# Patient Record
Sex: Female | Born: 1965 | Race: Black or African American | Hispanic: No | Smoking: Never smoker
Health system: Southern US, Community
[De-identification: ages and names within clinical notes are randomized; demographics above are authoritative.]

## PROBLEM LIST (undated history)

## (undated) DIAGNOSIS — Z825 Family history of asthma and other chronic lower respiratory diseases: Secondary | ICD-10-CM

## (undated) DIAGNOSIS — Z8679 Personal history of other diseases of the circulatory system: Secondary | ICD-10-CM

## (undated) DIAGNOSIS — Z8249 Family history of ischemic heart disease and other diseases of the circulatory system: Secondary | ICD-10-CM

## (undated) DIAGNOSIS — N915 Oligomenorrhea, unspecified: Secondary | ICD-10-CM

## (undated) DIAGNOSIS — K219 Gastro-esophageal reflux disease without esophagitis: Secondary | ICD-10-CM

## (undated) DIAGNOSIS — Z8669 Personal history of other diseases of the nervous system and sense organs: Secondary | ICD-10-CM

## (undated) DIAGNOSIS — R638 Other symptoms and signs concerning food and fluid intake: Secondary | ICD-10-CM

## (undated) DIAGNOSIS — B977 Papillomavirus as the cause of diseases classified elsewhere: Secondary | ICD-10-CM

## (undated) DIAGNOSIS — IMO0002 Reserved for concepts with insufficient information to code with codable children: Secondary | ICD-10-CM

## (undated) DIAGNOSIS — J40 Bronchitis, not specified as acute or chronic: Secondary | ICD-10-CM

## (undated) DIAGNOSIS — Z8742 Personal history of other diseases of the female genital tract: Secondary | ICD-10-CM

## (undated) DIAGNOSIS — Z87442 Personal history of urinary calculi: Secondary | ICD-10-CM

## (undated) DIAGNOSIS — N870 Mild cervical dysplasia: Secondary | ICD-10-CM

## (undated) DIAGNOSIS — B009 Herpesviral infection, unspecified: Secondary | ICD-10-CM

## (undated) DIAGNOSIS — R896 Abnormal cytological findings in specimens from other organs, systems and tissues: Secondary | ICD-10-CM

## (undated) DIAGNOSIS — Z8619 Personal history of other infectious and parasitic diseases: Secondary | ICD-10-CM

## (undated) DIAGNOSIS — D649 Anemia, unspecified: Secondary | ICD-10-CM

## (undated) DIAGNOSIS — Z8349 Family history of other endocrine, nutritional and metabolic diseases: Secondary | ICD-10-CM

## (undated) DIAGNOSIS — D869 Sarcoidosis, unspecified: Secondary | ICD-10-CM

## (undated) DIAGNOSIS — R51 Headache: Secondary | ICD-10-CM

## (undated) DIAGNOSIS — R011 Cardiac murmur, unspecified: Secondary | ICD-10-CM

## (undated) HISTORY — DX: Herpesviral infection, unspecified: B00.9

## (undated) HISTORY — DX: Sarcoidosis, unspecified: D86.9

## (undated) HISTORY — DX: Family history of asthma and other chronic lower respiratory diseases: Z82.5

## (undated) HISTORY — DX: Anemia, unspecified: D64.9

## (undated) HISTORY — DX: Papillomavirus as the cause of diseases classified elsewhere: B97.7

## (undated) HISTORY — PX: TUBAL LIGATION: SHX77

## (undated) HISTORY — PX: DILATION AND CURETTAGE OF UTERUS: SHX78

## (undated) HISTORY — DX: Personal history of other infectious and parasitic diseases: Z86.19

## (undated) HISTORY — DX: Mild cervical dysplasia: N87.0

## (undated) HISTORY — DX: Family history of ischemic heart disease and other diseases of the circulatory system: Z82.49

## (undated) HISTORY — DX: Oligomenorrhea, unspecified: N91.5

## (undated) HISTORY — DX: Other symptoms and signs concerning food and fluid intake: R63.8

## (undated) HISTORY — DX: Personal history of other diseases of the nervous system and sense organs: Z86.69

## (undated) HISTORY — DX: Reserved for concepts with insufficient information to code with codable children: IMO0002

## (undated) HISTORY — PX: WISDOM TOOTH EXTRACTION: SHX21

## (undated) HISTORY — DX: Abnormal cytological findings in specimens from other organs, systems and tissues: R89.6

## (undated) HISTORY — PX: EYE SURGERY: SHX253

## (undated) HISTORY — DX: Family history of other endocrine, nutritional and metabolic diseases: Z83.49

## (undated) HISTORY — DX: Personal history of other diseases of the circulatory system: Z86.79

## (undated) HISTORY — DX: Personal history of other diseases of the female genital tract: Z87.42

---

## 2006-06-21 DIAGNOSIS — IMO0002 Reserved for concepts with insufficient information to code with codable children: Secondary | ICD-10-CM

## 2006-06-21 DIAGNOSIS — R87619 Unspecified abnormal cytological findings in specimens from cervix uteri: Secondary | ICD-10-CM

## 2006-06-21 HISTORY — DX: Unspecified abnormal cytological findings in specimens from cervix uteri: R87.619

## 2006-06-21 HISTORY — DX: Reserved for concepts with insufficient information to code with codable children: IMO0002

## 2011-01-20 DIAGNOSIS — IMO0001 Reserved for inherently not codable concepts without codable children: Secondary | ICD-10-CM

## 2011-01-20 DIAGNOSIS — B977 Papillomavirus as the cause of diseases classified elsewhere: Secondary | ICD-10-CM

## 2011-01-20 DIAGNOSIS — R638 Other symptoms and signs concerning food and fluid intake: Secondary | ICD-10-CM

## 2011-01-20 HISTORY — DX: Papillomavirus as the cause of diseases classified elsewhere: B97.7

## 2011-01-20 HISTORY — DX: Reserved for inherently not codable concepts without codable children: IMO0001

## 2011-01-20 HISTORY — DX: Other symptoms and signs concerning food and fluid intake: R63.8

## 2011-02-15 ENCOUNTER — Other Ambulatory Visit: Payer: Self-pay | Admitting: Obstetrics and Gynecology

## 2011-02-15 DIAGNOSIS — Z1231 Encounter for screening mammogram for malignant neoplasm of breast: Secondary | ICD-10-CM

## 2011-02-26 ENCOUNTER — Other Ambulatory Visit: Payer: Self-pay | Admitting: Obstetrics and Gynecology

## 2011-03-12 ENCOUNTER — Ambulatory Visit
Admission: RE | Admit: 2011-03-12 | Discharge: 2011-03-12 | Disposition: A | Discharge status: Home / Self Care | Payer: Commercial Managed Care - PPO | Source: Ambulatory Visit | Attending: Obstetrics and Gynecology | Admitting: Obstetrics and Gynecology

## 2011-03-12 DIAGNOSIS — Z1231 Encounter for screening mammogram for malignant neoplasm of breast: Secondary | ICD-10-CM

## 2011-03-16 ENCOUNTER — Encounter (HOSPITAL_COMMUNITY): Payer: Self-pay | Admitting: *Deleted

## 2011-03-22 DIAGNOSIS — N915 Oligomenorrhea, unspecified: Secondary | ICD-10-CM

## 2011-03-22 DIAGNOSIS — N870 Mild cervical dysplasia: Secondary | ICD-10-CM

## 2011-03-22 DIAGNOSIS — Z8742 Personal history of other diseases of the female genital tract: Secondary | ICD-10-CM

## 2011-03-22 HISTORY — DX: Mild cervical dysplasia: N87.0

## 2011-03-22 HISTORY — DX: Oligomenorrhea, unspecified: N91.5

## 2011-03-22 HISTORY — DX: Personal history of other diseases of the female genital tract: Z87.42

## 2011-03-22 HISTORY — PX: NOVASURE ABLATION: SHX5394

## 2011-03-31 NOTE — H&P (Signed)
NAME:  Ruth Hale, Ruth Hale NO.:  [id number]  MEDICAL RECORD NO.:  [id number]  LOCATION:  [facility]                         FACILITY:  [facility]  PHYSICIAN:  Osborn Coho, M.D.   DATE OF BIRTH:  03/19/1966  DATE OF ADMISSION:  02/26/2011 DATE OF DISCHARGE:                             HISTORY & PHYSICAL   HISTORY OF PRESENT ILLNESS:  Ruth Hale is a 45 year old divorced black female, para 2-0-0-2 presenting for hysteroscopy, D and C, and endometrial ablation because of metrorrhagia.  The patient reports a 7- year history of irregular and heavy bleeding that over the past year has worsened.  The patient's bleeding is characterized by a 3-7 day flow during which time she may change a pad along with a tampon every 15-30 minutes.  Toward the end of her menstrual flow, it may taper to her only requiring a pad and tampon change hourly.  Additionally, the patient experiences cramping that she rates at a 10/10 on a 10 point pain scale.  She is able to find some relief with ibuprofen 800 mg decreasing her discomfort to 6/10 on a 10 point pain scale.  The patient further gives a history of missing periods. She has gone as long as 6 months without periods, though she experiences monthly molimina.  An endometrial biopsy done August 2012 did not show any atypia, hyperplasia, or malignancy.  A pelvic ultrasound at the same time showed a uterus measuring 6.81 x 4.52 x 3.27 cm, a normal left ovary measuring 3.80 x 3.64 x 2.78 cm containing a simple ovarian cyst measuring 4.47 x 2.66 x 3.92 cm, and a normal-appearing right ovary measuring 2.13 x 2.48 x 1.23 cm.  She denies any urinary tract symptoms, changes in her bowel movements, dyspareunia, vaginitis symptoms, nausea or vomiting. Review of both medical and surgical management options were given to the patient, however, she desires to proceed with management of her symptoms with hysteroscopy, D and C, and endometrial ablation.  PAST  MEDICAL HISTORY:  OB HISTORY:  Gravida 2, para 2-0-0-2.  The patient had a cesarean section in 1992 and spontaneous vaginal birth in 1997.  GYN HISTORY:  Menarche at 45 years old.  Last menstrual period January 11, 2011.  The patient uses bilateral tubal ligation as a method of contraception.  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  The patient underwent a LEEP procedure in 2008 for abnormal Pap smears.  Her recent Pap smear showed atypical squamous cells of undetermined significance, and a positive high risk HPV.  Subsequent colposcopy returned CIN I.  MEDICAL HISTORY:  Sarcoidosis, anemia, rheumatic fever, and vitamin D deficiency.  SURGICAL HISTORY:  In 2000 bilateral tubal ligation and D and C.  She denies any history of blood transfusions or problems with anesthesia.  FAMILY HISTORY:  Cardiovascular disease, asthma, thyroid disease, breast cancer (mother - menopausal), diabetes mellitus, migraines, and stroke.  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  HABITS:  She does not use tobacco or illicit drugs.  She rarely consumes alcohol.  CURRENT MEDICATIONS: 1. Multivitamin daily. 2. Vitamin D 50,000 units weekly.  ALLERGIES:  She has no known drug allergies.  Denies any sensitivities to latex, peanuts, shellfish, or soy.  REVIEW OF SYSTEMS:  The patient does wear glasses.  She occasionally has night sweats.  Denies any headache, vision changes, difficulty swallowing, chronic cough, nausea, vomiting diarrhea, chest pain, shortness of breath, dysuria, hematuria, urinary frequency, urgency, or incontinence, flank pain, myalgias, arthralgias, or swelling of joints. Except as is mentioned in history of present illness, the patient's review of systems is otherwise negative.  PHYSICAL EXAMINATION:  VITAL SIGNS:  Blood pressure 120/88, pulse is 70, respiration 14, temperature 99 degrees  Fahrenheit orally, weight 253 pounds, height 5 feet 6 inches tall, body mass index 40.8. NECK:  Supple without masses.  There is no thyromegaly or cervical adenopathy. HEART:  Regular rate and rhythm. LUNGS:  Clear. BACK:  No CVA tenderness. ABDOMEN:  No tenderness, masses, or organomegaly. EXTREMITIES:  No clubbing, cyanosis, or edema. PELVIC:  EGBUS is normal.  Vagina is normal.  Cervix is nontender without lesions and is anterior.  Uterus is normal size, shape, and consistency without tenderness.  Adnexa, no tenderness or masses.  IMPRESSION: 1. Metrorrhagia. 2. Oligomenorrhea. 3. Simple Ovarian Cyst  DISPOSITION:  A discussion was held with the patient regarding the indications for her procedures along with their risks which include, but are not limited to reaction to anesthesia, damage to adjacent organs, excessive bleeding, infection, and scarring of the endometrium.  The patient verbalized understanding of these risks and has consented to proceed with hysteroscopy, D and C followed by hydrothermal ablation at [facility] on April 02, 2011 at 9:30 a.m.  She was given a copy of the [employer] brochure on hysteroscopy and D and C.     Mary Secord J. Lowell Guitar, P.A.-C.   ______________________________ Osborn Coho, M.D.    EJP/MEDQ  D:  03/31/2011  T:  03/31/2011  Job:  [id number]  Agree with Above - Jannetta Quint

## 2011-04-02 ENCOUNTER — Encounter (HOSPITAL_COMMUNITY): Payer: Self-pay | Admitting: Anesthesiology

## 2011-04-02 ENCOUNTER — Other Ambulatory Visit: Payer: Self-pay | Admitting: Obstetrics and Gynecology

## 2011-04-02 ENCOUNTER — Ambulatory Visit (HOSPITAL_COMMUNITY): Payer: Commercial Managed Care - PPO | Admitting: Anesthesiology

## 2011-04-02 ENCOUNTER — Ambulatory Visit (HOSPITAL_COMMUNITY)
Admission: RE | Admit: 2011-04-02 | Discharge: 2011-04-02 | Disposition: A | Discharge status: Home / Self Care | Payer: Commercial Managed Care - PPO | Source: Ambulatory Visit | Attending: Obstetrics and Gynecology | Admitting: Obstetrics and Gynecology

## 2011-04-02 ENCOUNTER — Encounter (HOSPITAL_COMMUNITY)
Admission: RE | Disposition: A | Discharge status: Home / Self Care | Payer: Self-pay | Source: Ambulatory Visit | Attending: Obstetrics and Gynecology

## 2011-04-02 DIAGNOSIS — N915 Oligomenorrhea, unspecified: Secondary | ICD-10-CM | POA: Insufficient documentation

## 2011-04-02 DIAGNOSIS — N83209 Unspecified ovarian cyst, unspecified side: Secondary | ICD-10-CM | POA: Insufficient documentation

## 2011-04-02 DIAGNOSIS — N921 Excessive and frequent menstruation with irregular cycle: Secondary | ICD-10-CM | POA: Insufficient documentation

## 2011-04-02 LAB — CBC
Platelets: 237 10*3/uL (ref 150–400)
RBC: 4.61 MIL/uL (ref 3.87–5.11)
WBC: 3.3 10*3/uL — ABNORMAL LOW (ref 4.0–10.5)

## 2011-04-02 LAB — HCG, SERUM, QUALITATIVE: Preg, Serum: NEGATIVE

## 2011-04-02 SURGERY — DILATATION & CURETTAGE/HYSTEROSCOPY WITH HYDROTHERMAL ABLATION
Anesthesia: General | Site: Vagina | Wound class: Clean Contaminated

## 2011-04-02 MED ORDER — SODIUM CHLORIDE 0.9 % IR SOLN
Status: DC | PRN
  Administered 2011-04-02: 3000 mL

## 2011-04-02 MED ORDER — IBUPROFEN 600 MG PO TABS: 600.0000 mg | ORAL_TABLET | Freq: Four times a day (QID) | ORAL | Status: AC | PRN

## 2011-04-02 MED ORDER — FENTANYL CITRATE 0.05 MG/ML IJ SOLN
INTRAMUSCULAR | Status: AC
  Filled 2011-04-02: qty 5

## 2011-04-02 MED ORDER — BUPIVACAINE-EPINEPHRINE 0.25% -1:200000 IJ SOLN: INTRAMUSCULAR | Status: DC | PRN

## 2011-04-02 MED ORDER — ONDANSETRON HCL 4 MG/2ML IJ SOLN
INTRAMUSCULAR | Status: DC | PRN
  Administered 2011-04-02: 4 mg via INTRAVENOUS

## 2011-04-02 MED ORDER — DEXAMETHASONE SODIUM PHOSPHATE 4 MG/ML IJ SOLN
INTRAMUSCULAR | Status: DC | PRN
  Administered 2011-04-02: 10 mg via INTRAVENOUS

## 2011-04-02 MED ORDER — LIDOCAINE HCL (CARDIAC) 20 MG/ML IV SOLN
INTRAVENOUS | Status: AC
  Filled 2011-04-02: qty 5

## 2011-04-02 MED ORDER — LIDOCAINE HCL (CARDIAC) 20 MG/ML IV SOLN
INTRAVENOUS | Status: DC | PRN
  Administered 2011-04-02: 50 mg via INTRAVENOUS
  Administered 2011-04-02: 40 mg via INTRAVENOUS

## 2011-04-02 MED ORDER — FENTANYL CITRATE 0.05 MG/ML IJ SOLN
INTRAMUSCULAR | Status: DC | PRN
  Administered 2011-04-02 (×5): 50 ug via INTRAVENOUS

## 2011-04-02 MED ORDER — PROPOFOL 10 MG/ML IV EMUL
INTRAVENOUS | Status: AC
  Filled 2011-04-02: qty 20

## 2011-04-02 MED ORDER — ONDANSETRON HCL 4 MG/2ML IJ SOLN
INTRAMUSCULAR | Status: AC
  Filled 2011-04-02: qty 2

## 2011-04-02 MED ORDER — KETOROLAC TROMETHAMINE 30 MG/ML IJ SOLN
INTRAMUSCULAR | Status: AC
  Filled 2011-04-02: qty 1

## 2011-04-02 MED ORDER — GLYCOPYRROLATE 0.2 MG/ML IJ SOLN
INTRAMUSCULAR | Status: DC | PRN
  Administered 2011-04-02: 0.1 mg via INTRAVENOUS

## 2011-04-02 MED ORDER — MIDAZOLAM HCL 5 MG/5ML IJ SOLN
INTRAMUSCULAR | Status: DC | PRN
  Administered 2011-04-02: 1 mg via INTRAVENOUS

## 2011-04-02 MED ORDER — DEXAMETHASONE SODIUM PHOSPHATE 10 MG/ML IJ SOLN
INTRAMUSCULAR | Status: AC
  Filled 2011-04-02: qty 1

## 2011-04-02 MED ORDER — HYDROCODONE-ACETAMINOPHEN 5-500 MG PO TABS: 1.0000 | ORAL_TABLET | Freq: Four times a day (QID) | ORAL | Status: AC | PRN

## 2011-04-02 MED ORDER — KETOROLAC TROMETHAMINE 30 MG/ML IJ SOLN
INTRAMUSCULAR | Status: DC | PRN
  Administered 2011-04-02: 30 mg via INTRAVENOUS

## 2011-04-02 MED ORDER — LACTATED RINGERS IV SOLN
INTRAVENOUS | Status: DC
  Administered 2011-04-02 (×2): via INTRAVENOUS
  Administered 2011-04-02: 1000 mL via INTRAVENOUS

## 2011-04-02 MED ORDER — MIDAZOLAM HCL 2 MG/2ML IJ SOLN
INTRAMUSCULAR | Status: AC
  Filled 2011-04-02: qty 2

## 2011-04-02 MED ORDER — LIDOCAINE HCL 1 % IJ SOLN
INTRAMUSCULAR | Status: DC | PRN
  Administered 2011-04-02: 10 mL

## 2011-04-02 MED ORDER — PROPOFOL 10 MG/ML IV EMUL
INTRAVENOUS | Status: DC | PRN
  Administered 2011-04-02: 250 mg via INTRAVENOUS

## 2011-04-02 SURGICAL SUPPLY — 16 items
CANISTER SUCTION 2500CC (MISCELLANEOUS) ×2 IMPLANT
CATH ROBINSON RED A/P 16FR (CATHETERS) ×2 IMPLANT
CLOTH BEACON ORANGE TIMEOUT ST (SAFETY) ×2 IMPLANT
CONTAINER PREFILL 10% NBF 60ML (FORM) ×4 IMPLANT
DRAPE CAMERA CLOSED 9X96 (DRAPES) ×2 IMPLANT
DRAPE HYSTEROSCOPY (DRAPE) ×2 IMPLANT
DRAPE UTILITY XL STRL (DRAPES) IMPLANT
GLOVE BIOGEL PI IND STRL 7.0 (GLOVE) ×1 IMPLANT
GLOVE BIOGEL PI INDICATOR 7.0 (GLOVE) ×1
GLOVE ECLIPSE 7.0 STRL STRAW (GLOVE) ×4 IMPLANT
GOWN PREVENTION PLUS LG XLONG (DISPOSABLE) ×2 IMPLANT
GOWN STRL REIN XL XLG (GOWN DISPOSABLE) ×4 IMPLANT
PACK VAGINAL MINOR WOMEN LF (CUSTOM PROCEDURE TRAY) ×2 IMPLANT
SET GENESYS HTA PROCERVA (MISCELLANEOUS) ×2 IMPLANT
TOWEL OR 17X24 6PK STRL BLUE (TOWEL DISPOSABLE) ×4 IMPLANT
WATER STERILE IRR 1000ML POUR (IV SOLUTION) ×2 IMPLANT

## 2011-04-02 NOTE — Transfer of Care (Signed)
Immediate Anesthesia Transfer of Care Note  Patient: Ruth Hale  Procedure(s) Performed:  DILATATION & CURETTAGE/HYSTEROSCOPY WITH HYDROTHERMAL ABLATION  Patient Location: PACU  Anesthesia Type: General  Level of Consciousness: awake, alert  and oriented  Airway & Oxygen Therapy: Patient Spontanous Breathing and Patient connected to nasal cannula oxygen  Post-op Assessment: Report given to PACU RN and Post -op Vital signs reviewed and stable  Post vital signs: Reviewed and stable  Complications: No apparent anesthesia complications

## 2011-04-02 NOTE — Op Note (Signed)
Preop Diagnosis: Metrorrhagia   Postop Diagnosis: Metrorrhagia   Procedure: DILATATION & CURETTAGE/HYSTEROSCOPY WITH HYDROTHERMAL ABLATION   Anesthesia: General   Anesthesiologist: Amy L. Rodman Pickle   Attending: Purcell Nails, MD   Assistant: n/a  Findings: No obvious intracavitary lesions.  Uterus sounded to 8cm.  Pathology: Endometrial Currettings  Fluids: 1700cc  UOP: QS via straight cath prior to procedure  EBL: minimal  Complications: none  Procedure: The patient was taken to the operating room after risks benefits and alternatives were discussed with patient, the patient verbalized understanding and consent signed and witnessed. The patient was placed under general anesthesia and prepped and draped in normal sterile fashion. A bivalve speculum was placed in the patient's vagina and the anterior lip of the cervix was grasped with a single-tooth tenaculum. The cervix was dilated for passage of the hysteroscope after paracervical block administered using 10cc of 1% lidocaine.  The uterus sounded to 8cm.  The hysteroscope was introduced into the uterine cavity with findings as noted above. A curettage was performed and currettings sent to pathology. The hysteroscope was reintroduced and hydrothermal ablation was performed without difficulty. The tenaculum and bivalve speculum were removed and there was good hemostasis at the tenaculum sites. Sponge lap and needle count was correct. The patient tolerated procedure well and was returned to the recovery room in good condition.

## 2011-04-02 NOTE — Anesthesia Preprocedure Evaluation (Addendum)
Anesthesia Evaluation  Name, MR# and DOB Patient awake  General Assessment Comment  Reviewed: Allergy & Precautions, H&P , NPO status , Patient's Chart, lab work & pertinent test results, reviewed documented beta blocker date and time   History of Anesthesia Complications Negative for: history of anesthetic complications  Airway Mallampati: II TM Distance: >3 FB Neck ROM: full    Dental  (+) Teeth Intact   Pulmonary  clear to auscultation        Cardiovascular Exercise Tolerance: Good regular Normal    Neuro/Psych Negative Neurological ROS  Negative Psych ROS   GI/Hepatic negative GI ROS Neg liver ROS    Endo/Other  Morbid obesity  Renal/GU negative Renal ROS  Genitourinary negative   Musculoskeletal   Abdominal   Peds  Hematology negative hematology ROS (+)   Anesthesia Other Findings Cutaneous sarcoidosis - prednisone in June 2012  Reproductive/Obstetrics negative OB ROS                           Anesthesia Physical Anesthesia Plan  ASA: II  Anesthesia Plan: General   Post-op Pain Management:    Induction: Intravenous  Airway Management Planned: LMA  Additional Equipment:   Intra-op Plan:   Post-operative Plan:   Informed Consent: I have reviewed the patients History and Physical, chart, labs and discussed the procedure including the risks, benefits and alternatives for the proposed anesthesia with the patient or authorized representative who has indicated his/her understanding and acceptance.   Dental advisory given  Plan Discussed with: CRNA and Surgeon  Anesthesia Plan Comments: (Recent steroids - may need stress dose steroids - will observe for hypotension.)       Anesthesia Quick Evaluation

## 2011-04-02 NOTE — OR Nursing (Signed)
HTA nurse Anola Gurney RN

## 2011-04-02 NOTE — Anesthesia Postprocedure Evaluation (Signed)
Anesthesia Post Note  Patient: Ruth Hale  Procedure(s) Performed:  DILATATION & CURETTAGE/HYSTEROSCOPY WITH HYDROTHERMAL ABLATION  Anesthesia type: General  Patient location: PACU  Post pain: Pain level controlled  Post assessment: Post-op Vital signs reviewed  Last Vitals:  Filed Vitals:   04/02/11 1205  BP:   Pulse: 55  Temp: 97.8 F (36.6 C)  Resp:     Post vital signs: Reviewed  Level of consciousness: sedated  Complications: No apparent anesthesia complications

## 2011-10-27 ENCOUNTER — Telehealth: Payer: Self-pay | Admitting: Obstetrics and Gynecology

## 2011-10-27 NOTE — Telephone Encounter (Signed)
Jackie received 

## 2011-10-28 ENCOUNTER — Telehealth: Payer: Self-pay

## 2011-10-28 NOTE — Telephone Encounter (Signed)
Per AR, I called in Valtrex 500 mg 1 po daily # 30 0 RF @ this time. Melody Comas A

## 2011-10-28 NOTE — Telephone Encounter (Signed)
Spoke to pt to let her know I called in her Valtrex RX, per AR. Melody Comas A

## 2011-11-12 ENCOUNTER — Encounter: Payer: Self-pay | Admitting: Obstetrics and Gynecology

## 2011-11-26 ENCOUNTER — Ambulatory Visit (INDEPENDENT_AMBULATORY_CARE_PROVIDER_SITE_OTHER): Payer: 59 | Admitting: Obstetrics and Gynecology

## 2011-11-26 ENCOUNTER — Encounter: Payer: Self-pay | Admitting: Obstetrics and Gynecology

## 2011-11-26 VITALS — BP 110/60 | Ht 67.0 in | Wt 248.0 lb

## 2011-11-26 DIAGNOSIS — R87610 Atypical squamous cells of undetermined significance on cytologic smear of cervix (ASC-US): Secondary | ICD-10-CM

## 2011-11-26 DIAGNOSIS — N912 Amenorrhea, unspecified: Secondary | ICD-10-CM

## 2011-11-26 DIAGNOSIS — Z9889 Other specified postprocedural states: Secondary | ICD-10-CM

## 2011-11-26 DIAGNOSIS — R87612 Low grade squamous intraepithelial lesion on cytologic smear of cervix (LGSIL): Secondary | ICD-10-CM

## 2011-11-26 DIAGNOSIS — N870 Mild cervical dysplasia: Secondary | ICD-10-CM | POA: Insufficient documentation

## 2011-11-26 LAB — POCT URINE PREGNANCY: Preg Test, Ur: NEGATIVE

## 2011-11-26 NOTE — Progress Notes (Signed)
Pt has no menses but s/p ablation in Oct 2012.  Here for repeat pap secondary to CIN 1.  Filed Vitals:   11/26/11 1140  BP: 110/60   ROS: noncontributory  Pelvic exam:  VULVA: normal appearing vulva with no masses, tenderness or lesions,  VAGINA: normal appearing vagina with normal color and discharge, no lesions, CERVIX: normal appearing cervix without discharge or lesions,  UTERUS: uterus is normal size, shape, consistency and nontender,  ADNEXA: normal adnexa in size, nontender and no masses.  Results for orders placed in visit on 11/26/11  POCT URINE PREGNANCY      Component Value Range   Preg Test, Ur Negative      A/P Pap today Pt also requesting a rx for celexa - denies SI or HI.  I informed pt I try not to routinely Rx psych meds and rec she see her prim MD.  She is ok with that.

## 2011-11-29 LAB — PAP IG W/ RFLX HPV ASCU

## 2011-11-30 ENCOUNTER — Encounter: Payer: Self-pay | Admitting: Obstetrics and Gynecology

## 2012-04-04 ENCOUNTER — Other Ambulatory Visit: Payer: Self-pay | Admitting: Obstetrics and Gynecology

## 2012-04-04 DIAGNOSIS — Z1231 Encounter for screening mammogram for malignant neoplasm of breast: Secondary | ICD-10-CM

## 2012-04-07 ENCOUNTER — Ambulatory Visit
Admission: RE | Admit: 2012-04-07 | Discharge: 2012-04-07 | Disposition: A | Discharge status: Home / Self Care | Payer: Commercial Managed Care - PPO | Source: Ambulatory Visit | Attending: Obstetrics and Gynecology | Admitting: Obstetrics and Gynecology

## 2012-04-07 DIAGNOSIS — Z1231 Encounter for screening mammogram for malignant neoplasm of breast: Secondary | ICD-10-CM

## 2012-05-12 ENCOUNTER — Ambulatory Visit (INDEPENDENT_AMBULATORY_CARE_PROVIDER_SITE_OTHER): Payer: 59 | Admitting: Obstetrics and Gynecology

## 2012-05-12 ENCOUNTER — Encounter: Payer: Self-pay | Admitting: Obstetrics and Gynecology

## 2012-05-12 VITALS — BP 128/82 | HR 84 | Temp 97.9°F | Wt 254.0 lb

## 2012-05-12 DIAGNOSIS — Z0142 Encounter for cervical smear to confirm findings of recent normal smear following initial abnormal smear: Secondary | ICD-10-CM

## 2012-05-12 DIAGNOSIS — R6889 Other general symptoms and signs: Secondary | ICD-10-CM

## 2012-05-12 DIAGNOSIS — N949 Unspecified condition associated with female genital organs and menstrual cycle: Secondary | ICD-10-CM

## 2012-05-12 DIAGNOSIS — R82998 Other abnormal findings in urine: Secondary | ICD-10-CM

## 2012-05-12 DIAGNOSIS — IMO0002 Reserved for concepts with insufficient information to code with codable children: Secondary | ICD-10-CM

## 2012-05-12 DIAGNOSIS — Z0189 Encounter for other specified special examinations: Secondary | ICD-10-CM

## 2012-05-12 DIAGNOSIS — N912 Amenorrhea, unspecified: Secondary | ICD-10-CM

## 2012-05-12 DIAGNOSIS — R8281 Pyuria: Secondary | ICD-10-CM

## 2012-05-12 DIAGNOSIS — R102 Pelvic and perineal pain: Secondary | ICD-10-CM

## 2012-05-12 LAB — POCT URINALYSIS DIPSTICK
Blood, UA: NEGATIVE
Glucose, UA: NEGATIVE
Nitrite, UA: NEGATIVE
Spec Grav, UA: 1.02
Urobilinogen, UA: NEGATIVE
pH, UA: 5

## 2012-05-12 NOTE — Progress Notes (Signed)
46 YO with a history of CIN-1 for repeat PAP.  Patient goes on to report pelvic pain x 2 weekl, off and on in both lower quadrants and is sharp and crampy.  Denies nausea, vomiting, diarrhea, fever, urinary tract symptoms,  constipation. or vaginitis symptoms.  Pain is relieved with Ibuprofen 800 mg.  Admits to starting Zumba.  O: Abdomen: soft, diffuse tenderness in both lower quadrants without guarding or rebound       Pelvic: EGBUS-wnl, vagina-normal, cervix-no lesions or tenderness, uterus/adnexae-no tenderness or masses  A: Repeat PAP (CIN-1)     Pelvic Pain  P: Reviewed causes of pelvic pain: urogenital, previous surgery, gastrointestinal and musculoskeletal.       Pelvic ultrasound to rule out pelvic masses        PAP sent        RTO-as scheduled or prn  Tristina Sahagian, PA-C

## 2012-05-15 ENCOUNTER — Telehealth: Payer: Self-pay

## 2012-05-15 MED ORDER — CIPROFLOXACIN HCL 500 MG PO TABS: ORAL_TABLET | ORAL | Status: DC

## 2012-05-15 NOTE — Telephone Encounter (Signed)
TC TO PT REGARDING UTI. INFORMED PT THAT WE NEED TO GIVE PT RX FOR TREATMENT OF UTI. WILL CALL IN CIPRO TO PT PHARMACY. PT VOICED UNDERSTANDING.

## 2012-05-15 NOTE — Telephone Encounter (Signed)
Message copied by Winfred Leeds on Mon May 15, 2012  2:02 PM ------      Message from: Henreitta Leber      Created: Mon May 15, 2012 12:10 PM       Notify patient of the need for antibiotics to treat a UTI.  She may be given Cipro 500 mg  #6 bid x 3 days no refills.  Thank you,  EP

## 2012-05-16 LAB — URINE CULTURE: Colony Count: 45000

## 2012-05-16 LAB — PAP IG W/ RFLX HPV ASCU

## 2012-05-29 ENCOUNTER — Encounter: Payer: Self-pay | Admitting: Obstetrics and Gynecology

## 2012-05-29 ENCOUNTER — Ambulatory Visit (INDEPENDENT_AMBULATORY_CARE_PROVIDER_SITE_OTHER): Payer: 59

## 2012-05-29 ENCOUNTER — Ambulatory Visit (INDEPENDENT_AMBULATORY_CARE_PROVIDER_SITE_OTHER): Payer: 59 | Admitting: Obstetrics and Gynecology

## 2012-05-29 VITALS — BP 120/78 | Temp 98.8°F | Wt 253.0 lb

## 2012-05-29 DIAGNOSIS — R102 Pelvic and perineal pain: Secondary | ICD-10-CM

## 2012-05-29 DIAGNOSIS — N949 Unspecified condition associated with female genital organs and menstrual cycle: Secondary | ICD-10-CM

## 2012-05-29 DIAGNOSIS — R1900 Intra-abdominal and pelvic swelling, mass and lump, unspecified site: Secondary | ICD-10-CM

## 2012-05-29 NOTE — Progress Notes (Signed)
46 YO with a history of CIN-1 seen recently with pelvic pain , returns for ultrasound.  Still has a pulling sensation on the left side (pelvic).   O:  Ultrasound: uterus-6.36 x 4.17 x 3.88 cm with normal appearing ovaries but noted:  Left adnexa has a cystic structure lateral to and inferior to the left ovary. Appearance consistent with a small hydrosalpinx, pelvic inclusion cyst possible also. measures 2.2 x 1.4 x 1.5 cm.   A:  Left sided pelvic pain-persistent      Left Adnexal Cystic Mass (hydrosalpinx vs inclusion cyst)   P:  F/U with Dr. Su Hilt for further evaluation and management        RTO-as scheduled or prn  Margareta Laureano, PA-C

## 2012-05-30 ENCOUNTER — Other Ambulatory Visit: Payer: Self-pay | Admitting: Obstetrics and Gynecology

## 2012-05-30 MED ORDER — HYDROCODONE-ACETAMINOPHEN 5-300 MG PO TABS: 5.0000 mg | ORAL_TABLET | ORAL | Status: DC | PRN

## 2012-05-31 ENCOUNTER — Telehealth: Payer: Self-pay | Admitting: Obstetrics and Gynecology

## 2012-05-31 NOTE — Telephone Encounter (Signed)
Late entry from 05/30/12 due to computer not functioning.  Spoke with pt.  States thought pain med was ordered but not at pharmacy.  Per EP Rx for Vicodin 5/300  Po 1 q 4-6 h prn #30 with 0 RF called to [facility] at [facility].  Pt notified.

## 2012-06-01 ENCOUNTER — Telehealth: Payer: Self-pay | Admitting: Obstetrics and Gynecology

## 2012-06-01 NOTE — Telephone Encounter (Signed)
TC  To pt.  Questioning if EP has consulted with DR AR regarding any procedure or other f/u that needs to be done. Has appt with Dr Alinda Sierras 07/05/11. States continues to have pain. Is taking med at night.  Message to EP.

## 2012-06-01 NOTE — Telephone Encounter (Signed)
Patient seen 05/29/2012 with complaints of pelvic pain and an adnexal mass.  Is going to follow up with Dr. Su Hilt sometime in January, but wants to know if she should be seen sooner or if Dr. Su Hilt has any recommendations for evaluation/management until that time.  Patient continues to have pain.  To transfer this concern to Dr. Su Hilt with reference to office visit date 05/29/2012 and ultrasound done at that time.  Kamrin Sibley, PA-C

## 2012-07-04 ENCOUNTER — Encounter: Payer: 59 | Admitting: Obstetrics and Gynecology

## 2012-07-05 ENCOUNTER — Encounter: Payer: Self-pay | Admitting: Obstetrics and Gynecology

## 2012-07-05 ENCOUNTER — Ambulatory Visit: Payer: 59 | Admitting: Obstetrics and Gynecology

## 2012-07-05 VITALS — BP 120/70 | Ht 67.0 in | Wt 256.0 lb

## 2012-07-05 DIAGNOSIS — B9689 Other specified bacterial agents as the cause of diseases classified elsewhere: Secondary | ICD-10-CM

## 2012-07-05 DIAGNOSIS — R102 Pelvic and perineal pain: Secondary | ICD-10-CM

## 2012-07-05 LAB — POCT WET PREP (WET MOUNT): pH: 5.5

## 2012-07-05 MED ORDER — TINIDAZOLE 500 MG PO TABS: 2.0000 g | ORAL_TABLET | Freq: Every day | ORAL | Status: DC

## 2012-07-05 MED ORDER — HYDROCODONE-ACETAMINOPHEN 5-500 MG PO TABS: 1.0000 | ORAL_TABLET | Freq: Four times a day (QID) | ORAL | Status: DC | PRN

## 2012-07-05 NOTE — Addendum Note (Signed)
Addended by: Marla Roe A on: 07/05/2012 03:09 PM   Modules accepted: Orders

## 2012-07-05 NOTE — Progress Notes (Signed)
Here to f/u left pelvic pain that has persisted and pt has on u/s findings of a left hydrosalpinx vs left inclusion cyst.  Pt not relieved with motrin but is improved with vicodin.  Filed Vitals:   07/05/12 1413  BP: 120/70   ROS: noncontributory  Pelvic exam:  VULVA: normal appearing vulva with no masses, tenderness or lesions,  VAGINA: normal appearing vagina with normal color and discharge, no lesions, white d/c CERVIX: normal appearing cervix without discharge or lesions,  UTERUS: uterus is normal size, shape, consistency and nontender,  ADNEXA: normal adnexa in size, pos tenderness on left and no masses.  A/P I discussed options of pain mgmt vs cyst removal but if the pain is only controlled with narcotic in order to avoid dependence, I rec laparoscopy with removal of bilateral tubes. UCx today for TOC s/p tx for UTI in Nov Wet prep - BV - tindamax Refill Vicodin for pain mgmt until surgery Doing well s/p HTA

## 2012-07-05 NOTE — Addendum Note (Signed)
Addended by: Marla Roe A on: 07/05/2012 03:08 PM   Modules accepted: Orders

## 2012-07-07 LAB — URINE CULTURE
Colony Count: NO GROWTH
Organism ID, Bacteria: NO GROWTH

## 2012-07-31 ENCOUNTER — Telehealth: Payer: Self-pay | Admitting: Obstetrics and Gynecology

## 2012-07-31 NOTE — Telephone Encounter (Signed)
Spoke to pharmacist who states pt is just now filling Rx for Vicodin that was written in January. The 5/500 strength isn't available. The closest dose they have is the 5/325 mg. This is ok, with same sig per AR.  No RF's. Melody Comas A

## 2012-08-02 ENCOUNTER — Telehealth: Payer: Self-pay | Admitting: Obstetrics and Gynecology

## 2012-08-02 NOTE — Telephone Encounter (Signed)
Laparoscopic bilateral Salpingectomy scheduled for 09/12/12 @ 9:30 with AR. [employer] effective 08/17/10. Plan pays 80/20 after a $750 deductible. Pre-op due $270.63. -Adrianne Pridgen

## 2012-08-02 NOTE — Telephone Encounter (Signed)
Laparoscopic bilateral Salpingectomy scheduled for 09/12/12 @ 9:30 with AR/EP. [employer] effective 08/17/10. Plan pays 80/20 after a $750 deductible. Pre-op due $270.63. -Adrianne Pridgen

## 2012-08-11 ENCOUNTER — Other Ambulatory Visit: Payer: Self-pay | Admitting: Obstetrics and Gynecology

## 2012-08-28 ENCOUNTER — Encounter: Payer: Self-pay | Admitting: Obstetrics and Gynecology

## 2012-08-28 ENCOUNTER — Encounter (HOSPITAL_COMMUNITY): Payer: Self-pay

## 2012-08-30 ENCOUNTER — Encounter: Payer: 59 | Admitting: Obstetrics and Gynecology

## 2012-08-31 ENCOUNTER — Encounter: Payer: Self-pay | Admitting: Obstetrics and Gynecology

## 2012-08-31 ENCOUNTER — Ambulatory Visit: Payer: 59 | Admitting: Obstetrics and Gynecology

## 2012-08-31 VITALS — BP 118/72 | HR 78 | Temp 98.1°F | Ht 67.0 in | Wt 265.0 lb

## 2012-08-31 DIAGNOSIS — R102 Pelvic and perineal pain: Secondary | ICD-10-CM

## 2012-08-31 DIAGNOSIS — R1900 Intra-abdominal and pelvic swelling, mass and lump, unspecified site: Secondary | ICD-10-CM

## 2012-08-31 DIAGNOSIS — Z01818 Encounter for other preprocedural examination: Secondary | ICD-10-CM

## 2012-08-31 DIAGNOSIS — N390 Urinary tract infection, site not specified: Secondary | ICD-10-CM

## 2012-08-31 LAB — POCT URINALYSIS DIPSTICK
Nitrite, UA: NEGATIVE
Protein, UA: NEGATIVE
Urobilinogen, UA: NEGATIVE
pH, UA: 5

## 2012-08-31 NOTE — Progress Notes (Signed)
Ruth Hale is a 46 y.o. female G2P2 who presents for laparoscopy because of pelvic pain and a left adnexal mass. For several months the patient reports intermittent pelvic pain- left greater than right, that is sometimes worse with going from sitting to standing and always painful with intercourse. Pain is described as a pulling/sharp pain that may last for 10 minutes at at time and keep her from sleeping. Pain is rated as an 8/10 on a 10 point pain scale with total relief from Vicodin (Ibuprofen has no effect on her pain). Denies bladder/bowel function changes or vaginitis symptoms. A pelvic ultrasound in December 2013 showed a uterus: 6.36 x 4.17 x 3.88 cm, left adnexal cystic structure lateral and inferior to left ovary measuring 2.2 x 1.4 x 1.5 cm ? small hydrosalpinx vs inclusion cyst otherwise ovaries appeared normal. A review of both medical and surgical management options were given to patient however, she wishes to proceed with surgical evaluation and management.   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; History of abnormal PAP smear treated with LEEP in 2012; Last PAP smear: Novermber 2013   Medical History: anemia, migraines, sarcoidosis (skin involvement only) and rheumatic fever   Surgical History: 2000 Tubal Sterilization and Tear Duct Surgery; 2008 Loop Electrosurgical Excision Procedure and 2012 Hysteroscopy, Dilation/Curettage and Hydrothermal Ablation  Denies problems with anesthesia or history of blood transfusions   Family History: stroke, breast cancer (mother age 62), cardiovascular disease, and thyroid disease   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  diphenhydrAMINE (BENADRYL) 25 mg capsule  Take 25-50 mg by mouth daily as needed for allergies.     .  HYDROcodone-acetaminophen (VICODIN) 5-500 MG per tablet  Take 1-2 tablets by mouth every 6 (six) hours as needed for pain.  30 tablet  0   .  hydroxychloroquine (PLAQUENIL) 200 MG tablet  Take 400 mg by mouth daily.     .  Multiple Vitamin (MULTIVITAMIN) tablet  Take 1 tablet by mouth daily.     .  oxymetazoline (AFRIN) 0.05 % nasal spray  Place 2 sprays into the nose 2 (two) times daily as needed for congestion.     .  [DISCONTINUED] Hydrocodone-Acetaminophen 5-300 MG TABS  Take 5-300 mg by mouth every 4 (four) hours as needed.  30 each  0   .  [DISCONTINUED] tinidazole (TINDAMAX) 500 MG tablet  Take 4 tablets (2,000 mg total) by mouth daily. For 2 days.  8 tablet  0    No facility-administered encounter medications on file as of 08/31/2012.     No Known Allergies  Denies sensitivity to peanuts, shellfish, soy, latex or adhesives.    ROS: Admits to glasses and rash secondary to sarcoidosis but denies headache, vision changes, nasal congestion, dysphagia, tinnitus, dizziness, hoarseness, cough, chest pain, shortness of breath, nausea, vomiting, diarrhea,constipation, urinary frequency, urgency dysuria, hematuria, vaginitis symptoms, swelling of joints,easy bruising, myalgias, arthralgias, unexplained weight loss and except as is mentioned in the history of present illness, patient's review of systems is otherwise negative.    Physical Exam  BP 118/72  Pulse 78  Temp(Src) 98.1 F (36.7 C) (Oral)  Ht 5' 7" (  1.702 m)  Wt 265 lb (120.203 kg)  BMI 41.5 kg/m2  Neck: supple without masses or thyromegaly  Lungs: clear to auscultation  Heart: regular rate and rhythm  Abdomen: soft, non-tender and no organomegaly  Pelvic:EGBUS- wnl; vagina-normal rugae; uterus-normal size, cervix without lesions or motion tenderness; adnexae-no tenderness or masses  Extremities: no  clubbing, cyanosis or edema    Assesment: Pelvic Pain                       Left Adnexal Cystic Mass   Disposition: A discussion was held with patient regarding the indication for her procedure(s) along with the risks, which include but are not limited to: reaction to anesthesia, damage to adjacent organs, infection and excessive bleeding. The patient verbalized understanding of these risks an has consented to proceed with Operative Laparoscopy with Bilateral Salpingectomy at [facility] on September 12, 2012 at 9:30 a.m.   CSN# [id number]  Leoni Goodness J. Samiha Denapoli, PA-C for Dr. Angela Y. Roberts   

## 2012-09-02 LAB — URINE CULTURE: Colony Count: 45000

## 2012-09-03 NOTE — Progress Notes (Signed)
Ruth Hale is a 47 y.o. female G2P2 who presents for laparoscopy because of pelvic pain and a left adnexal mass.  For several months the  patient reports intermittent pelvic pain- left greater than right, that is sometimes worse with going from sitting to standing and always painful with intercourse.  Pain is described as a pulling/sharp pain that may last for 10 minutes at at time and keep her from sleeping.  Pain is rated as an 8/10 on a 10 point pain scale with total relief from Vicodin (Ibuprofen has no effect on her pain).  Denies bladder/bowel function changes or vaginitis symptoms.  A pelvic ultrasound in December 2013 showed a uterus: 6.36 x 4.17 x 3.88 cm, left adnexal cystic structure lateral and inferior to left ovary measuring 2.2 x 1.4 x 1.5 cm ? small hydrosalpinx vs inclusion cyst otherwise ovaries appeared normal. A review of both medical and surgical management options were given to patient however, she wishes to proceed with surgical evaluation and management.  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;  History of abnormal PAP smear treated with LEEP in 2012;   Last PAP smear: Novermber 2013  Medical History: anemia, migraines, sarcoidosis (skin involvement only) and rheumatic fever  Surgical History: 2000 Tubal Sterilization and Tear Duct Surgery; 2008 Loop Electrosurgical Excision Procedure and 2012 Hysteroscopy, Dilation/Curettage and Hydrothermal Ablation Denies problems with anesthesia or history of blood transfusions  Family History: stroke, breast cancer (mother age 67), cardiovascular disease, and thyroid disease  Social History:  Single and employed as a Scientist, forensic; Occasionally consumes alcohol but denies tobacco and illicit drug use   Outpatient Encounter Prescriptions as of 08/31/2012  Medication Sig  Dispense Refill  . diphenhydrAMINE (BENADRYL) 25 mg capsule Take 25-50 mg by mouth daily as needed for allergies.      Marland Kitchen HYDROcodone-acetaminophen (VICODIN) 5-500 MG per tablet Take 1-2 tablets by mouth every 6 (six) hours as needed for pain.  30 tablet  0  . hydroxychloroquine (PLAQUENIL) 200 MG tablet Take 400 mg by mouth daily.       . Multiple Vitamin (MULTIVITAMIN) tablet Take 1 tablet by mouth daily.      Marland Kitchen oxymetazoline (AFRIN) 0.05 % nasal spray Place 2 sprays into the nose 2 (two) times daily as needed for congestion.      . [DISCONTINUED] Hydrocodone-Acetaminophen 5-300 MG TABS Take 5-300 mg by mouth every 4 (four) hours as needed.  30 each  0  . [DISCONTINUED] tinidazole (TINDAMAX) 500 MG tablet Take 4 tablets (2,000 mg total) by mouth daily. For 2 days.  8 tablet  0   No facility-administered encounter medications on file as of 08/31/2012.    No Known Allergies  Denies sensitivity to peanuts, shellfish, soy, latex or adhesives.   ROS: Admits to glasses and rash secondary to sarcoidosis but denies headache, vision changes, nasal congestion, dysphagia, tinnitus, dizziness, hoarseness, cough,  chest pain, shortness of breath, nausea, vomiting, diarrhea,constipation,  urinary frequency, urgency  dysuria, hematuria, vaginitis symptoms, swelling of joints,easy bruising,  myalgias, arthralgias, unexplained weight loss and except as is mentioned in the history of present illness, patient's review of systems is otherwise negative.  Physical Exam    BP 118/72  Pulse 78  Temp(Src) 98.1 F (36.7 C) (Oral)  Ht 5\' 7"  (1.702 m)  Wt 265 lb (  120.203 kg)  BMI 41.5 kg/m2  Neck: supple without masses or thyromegaly Lungs: clear to auscultation Heart: regular rate and rhythm Abdomen: soft, non-tender and no organomegaly Pelvic:EGBUS- wnl; vagina-normal rugae; uterus-normal size, cervix without lesions or motion tenderness; adnexae-no tenderness or masses Extremities:  no clubbing, cyanosis or  edema   Assesment:  Pelvic Pain                       Left Adnexal Cystic Mass   Disposition:  A discussion was held with patient regarding the indication for her procedure(s) along with the risks, which include but are not limited to: reaction to anesthesia, damage to adjacent organs, infection and excessive bleeding. The patient verbalized understanding of these risks an has consented to proceed with Operative Laparoscopy with Bilateral Salpingectomy at [facility] [place] on September 12, 2012 at 9:30 a.m.   CSN# [id number]   Danila Eddie J. Lowell Guitar, PA-C  for Dr. Woodroe Mode. Su Hilt

## 2012-09-03 NOTE — Progress Notes (Signed)
Ruth Hale is a 46 y.o. female G2P2 who presents for laparoscopy because of pelvic pain and a left adnexal mass. For several months the patient reports intermittent pelvic pain- left greater than right, that is sometimes worse with going from sitting to standing and always painful with intercourse. Pain is described as a pulling/sharp pain that may last for 10 minutes at at time and keep her from sleeping. Pain is rated as an 8/10 on a 10 point pain scale with total relief from Vicodin (Ibuprofen has no effect on her pain). Denies bladder/bowel function changes or vaginitis symptoms. A pelvic ultrasound in December 2013 showed a uterus: 6.36 x 4.17 x 3.88 cm, left adnexal cystic structure lateral and inferior to left ovary measuring 2.2 x 1.4 x 1.5 cm ? small hydrosalpinx vs inclusion cyst otherwise ovaries appeared normal. A review of both medical and surgical management options were given to patient however, she wishes to proceed with surgical evaluation and management.   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; History of abnormal PAP smear treated with LEEP in 2012; Last PAP smear: Novermber 2013   Medical History: anemia, migraines, sarcoidosis (skin involvement only) and rheumatic fever   Surgical History: 2000 Tubal Sterilization and Tear Duct Surgery; 2008 Loop Electrosurgical Excision Procedure and 2012 Hysteroscopy, Dilation/Curettage and Hydrothermal Ablation  Denies problems with anesthesia or history of blood transfusions   Family History: stroke, breast cancer (mother age 62), cardiovascular disease, and thyroid disease   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  diphenhydrAMINE (BENADRYL) 25 mg capsule  Take 25-50 mg by mouth daily as needed for allergies.     .  HYDROcodone-acetaminophen (VICODIN) 5-500 MG per tablet  Take 1-2 tablets by mouth every 6 (six) hours as needed for pain.  30 tablet  0   .  hydroxychloroquine (PLAQUENIL) 200 MG tablet  Take 400 mg by mouth daily.     .  Multiple Vitamin (MULTIVITAMIN) tablet  Take 1 tablet by mouth daily.     .  oxymetazoline (AFRIN) 0.05 % nasal spray  Place 2 sprays into the nose 2 (two) times daily as needed for congestion.     .  [DISCONTINUED] Hydrocodone-Acetaminophen 5-300 MG TABS  Take 5-300 mg by mouth every 4 (four) hours as needed.  30 each  0   .  [DISCONTINUED] tinidazole (TINDAMAX) 500 MG tablet  Take 4 tablets (2,000 mg total) by mouth daily. For 2 days.  8 tablet  0    No facility-administered encounter medications on file as of 08/31/2012.     No Known Allergies  Denies sensitivity to peanuts, shellfish, soy, latex or adhesives.    ROS: Admits to glasses and rash secondary to sarcoidosis but denies headache, vision changes, nasal congestion, dysphagia, tinnitus, dizziness, hoarseness, cough, chest pain, shortness of breath, nausea, vomiting, diarrhea,constipation, urinary frequency, urgency dysuria, hematuria, vaginitis symptoms, swelling of joints,easy bruising, myalgias, arthralgias, unexplained weight loss and except as is mentioned in the history of present illness, patient's review of systems is otherwise negative.    Physical Exam  BP 118/72  Pulse 78  Temp(Src) 98.1 F (36.7 C) (Oral)  Ht 5' 7" (  1.702 m)  Wt 265 lb (120.203 kg)  BMI 41.5 kg/m2  Neck: supple without masses or thyromegaly  Lungs: clear to auscultation  Heart: regular rate and rhythm  Abdomen: soft, non-tender and no organomegaly  Pelvic:EGBUS- wnl; vagina-normal rugae; uterus-normal size, cervix without lesions or motion tenderness; adnexae-no tenderness or masses  Extremities: no  clubbing, cyanosis or edema    Assesment: Pelvic Pain                       Left Adnexal Cystic Mass   Disposition: A discussion was held with patient regarding the indication for her procedure(s) along with the risks, which include but are not limited to: reaction to anesthesia, damage to adjacent organs, infection and excessive bleeding. The patient verbalized understanding of these risks an has consented to proceed with Operative Laparoscopy with Bilateral Salpingectomy at [facility] on September 12, 2012 at 9:30 a.m.   CSN# [id number]  Aurelius Gildersleeve J. Barba Solt, PA-C for Dr. Angela Y. Roberts   

## 2012-09-04 NOTE — H&P (Signed)
Ruth Hale is a 47 y.o. female G2P2 who presents for laparoscopy because of pelvic pain and a left adnexal mass. For several months the patient reports intermittent pelvic pain- left greater than right, that is sometimes worse with going from sitting to standing and always painful with intercourse. Pain is described as a pulling/sharp pain that may last for 10 minutes at at time and keep her from sleeping. Pain is rated as an 8/10 on a 10 point pain scale with total relief from Vicodin (Ibuprofen has no effect on her pain). Denies bladder/bowel function changes or vaginitis symptoms. A pelvic ultrasound in December 2013 showed a uterus: 6.36 x 4.17 x 3.88 cm, left adnexal cystic structure lateral and inferior to left ovary measuring 2.2 x 1.4 x 1.5 cm ? small hydrosalpinx vs inclusion cyst otherwise ovaries appeared normal. A review of both medical and surgical management options were given to patient however, she wishes to proceed with surgical evaluation and management.   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; History of abnormal PAP smear treated with LEEP in 2012; Last PAP smear: Novermber 2013   Medical History: anemia, migraines, sarcoidosis (skin involvement only) and rheumatic fever   Surgical History: 2000 Tubal Sterilization and Tear Duct Surgery; 2008 Loop Electrosurgical Excision Procedure and 2012 Hysteroscopy, Dilation/Curettage and Hydrothermal Ablation  Denies problems with anesthesia or history of blood transfusions   Family History: stroke, breast cancer (mother age 62), cardiovascular disease, and thyroid disease   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  diphenhydrAMINE (BENADRYL) 25 mg capsule  Take 25-50 mg by mouth daily as needed for allergies.     .  HYDROcodone-acetaminophen (VICODIN) 5-500 MG per tablet  Take 1-2 tablets by mouth every 6 (six) hours as needed for pain.  30 tablet  0   .  hydroxychloroquine (PLAQUENIL) 200 MG tablet  Take 400 mg by mouth daily.     .  Multiple Vitamin (MULTIVITAMIN) tablet  Take 1 tablet by mouth daily.     .  oxymetazoline (AFRIN) 0.05 % nasal spray  Place 2 sprays into the nose 2 (two) times daily as needed for congestion.     .  [DISCONTINUED] Hydrocodone-Acetaminophen 5-300 MG TABS  Take 5-300 mg by mouth every 4 (four) hours as needed.  30 each  0   .  [DISCONTINUED] tinidazole (TINDAMAX) 500 MG tablet  Take 4 tablets (2,000 mg total) by mouth daily. For 2 days.  8 tablet  0    No facility-administered encounter medications on file as of 08/31/2012.     No Known Allergies  Denies sensitivity to peanuts, shellfish, soy, latex or adhesives.    ROS: Admits to glasses and rash secondary to sarcoidosis but denies headache, vision changes, nasal congestion, dysphagia, tinnitus, dizziness, hoarseness, cough, chest pain, shortness of breath, nausea, vomiting, diarrhea,constipation, urinary frequency, urgency dysuria, hematuria, vaginitis symptoms, swelling of joints,easy bruising, myalgias, arthralgias, unexplained weight loss and except as is mentioned in the history of present illness, patient's review of systems is otherwise negative.    Physical Exam  BP 118/72  Pulse 78  Temp(Src) 98.1 F (36.7 C) (Oral)  Ht 5' 7" (  1.702 m)  Wt 265 lb (120.203 kg)  BMI 41.5 kg/m2  Neck: supple without masses or thyromegaly  Lungs: clear to auscultation  Heart: regular rate and rhythm  Abdomen: soft, non-tender and no organomegaly  Pelvic:EGBUS- wnl; vagina-normal rugae; uterus-normal size, cervix without lesions or motion tenderness; adnexae-no tenderness or masses  Extremities: no  clubbing, cyanosis or edema    Assesment: Pelvic Pain                       Left Adnexal Cystic Mass   Disposition: A discussion was held with patient regarding the indication for her procedure(s) along with the risks, which include but are not limited to: reaction to anesthesia, damage to adjacent organs, infection and excessive bleeding. The patient verbalized understanding of these risks an has consented to proceed with Operative Laparoscopy with Bilateral Salpingectomy at [facility] on September 12, 2012 at 9:30 a.m.   CSN# [id number]  Ames Hoban J. Thelton Graca, PA-C for Dr. Angela Y. Roberts   

## 2012-09-07 ENCOUNTER — Encounter (HOSPITAL_COMMUNITY): Payer: Self-pay

## 2012-09-07 ENCOUNTER — Encounter (HOSPITAL_COMMUNITY)
Admission: RE | Admit: 2012-09-07 | Discharge: 2012-09-07 | Disposition: A | Discharge status: Home / Self Care | Payer: Commercial Managed Care - PPO | Source: Ambulatory Visit | Attending: Obstetrics and Gynecology | Admitting: Obstetrics and Gynecology

## 2012-09-07 HISTORY — DX: Headache: R51

## 2012-09-07 HISTORY — DX: Gastro-esophageal reflux disease without esophagitis: K21.9

## 2012-09-07 LAB — CBC
HCT: 39.1 % (ref 36.0–46.0)
Hemoglobin: 12.1 g/dL (ref 12.0–15.0)
MCV: 84.3 fL (ref 78.0–100.0)
RDW: 14.7 % (ref 11.5–15.5)
WBC: 3.5 10*3/uL — ABNORMAL LOW (ref 4.0–10.5)

## 2012-09-07 NOTE — Patient Instructions (Signed)
Your procedure is scheduled on:09/12/12  Enter through the Main Entrance at : 8am Pick up desk phone and dial [phone] and inform us of your arrival.  Please call [phone] if you have any problems the morning of surgery.  Remember: Do not eat or drink after midnight:Monday   DO NOT wear jewelry, eye make-up, lipstick,body lotion, or dark fingernail polish.   If you are to be admitted after surgery, leave suitcase in car until your room has been assigned. Patients discharged on the day of surgery will not be allowed to drive home.

## 2012-09-12 ENCOUNTER — Ambulatory Visit (HOSPITAL_COMMUNITY): Payer: Commercial Managed Care - PPO | Admitting: Anesthesiology

## 2012-09-12 ENCOUNTER — Encounter (HOSPITAL_COMMUNITY): Payer: Self-pay | Admitting: Anesthesiology

## 2012-09-12 ENCOUNTER — Encounter (HOSPITAL_COMMUNITY)
Admission: RE | Disposition: A | Discharge status: Home / Self Care | Payer: Self-pay | Source: Ambulatory Visit | Attending: Obstetrics and Gynecology

## 2012-09-12 ENCOUNTER — Ambulatory Visit (HOSPITAL_COMMUNITY)
Admission: RE | Admit: 2012-09-12 | Discharge: 2012-09-12 | Disposition: A | Discharge status: Home / Self Care | Payer: Commercial Managed Care - PPO | Source: Ambulatory Visit | Attending: Obstetrics and Gynecology | Admitting: Obstetrics and Gynecology

## 2012-09-12 DIAGNOSIS — N838 Other noninflammatory disorders of ovary, fallopian tube and broad ligament: Secondary | ICD-10-CM | POA: Insufficient documentation

## 2012-09-12 DIAGNOSIS — N949 Unspecified condition associated with female genital organs and menstrual cycle: Secondary | ICD-10-CM | POA: Insufficient documentation

## 2012-09-12 DIAGNOSIS — K668 Other specified disorders of peritoneum: Secondary | ICD-10-CM | POA: Insufficient documentation

## 2012-09-12 HISTORY — PX: LAPAROSCOPY: SHX197

## 2012-09-12 HISTORY — PX: BILATERAL SALPINGECTOMY: SHX5743

## 2012-09-12 SURGERY — LAPAROSCOPY OPERATIVE
Anesthesia: General | Site: Abdomen | Wound class: Clean Contaminated

## 2012-09-12 MED ORDER — NEOSTIGMINE METHYLSULFATE 1 MG/ML IJ SOLN
INTRAMUSCULAR | Status: DC | PRN
  Administered 2012-09-12: 3 mg via INTRAVENOUS

## 2012-09-12 MED ORDER — PROPOFOL 10 MG/ML IV EMUL
INTRAVENOUS | Status: AC
  Filled 2012-09-12: qty 20

## 2012-09-12 MED ORDER — KETOROLAC TROMETHAMINE 30 MG/ML IJ SOLN: 15.0000 mg | Freq: Once | INTRAMUSCULAR | Status: DC | PRN

## 2012-09-12 MED ORDER — ONDANSETRON HCL 4 MG/2ML IJ SOLN
INTRAMUSCULAR | Status: AC
  Filled 2012-09-12: qty 2

## 2012-09-12 MED ORDER — BUPIVACAINE HCL (PF) 0.25 % IJ SOLN
INTRAMUSCULAR | Status: DC | PRN
  Administered 2012-09-12: 10 mL

## 2012-09-12 MED ORDER — MEPERIDINE HCL 25 MG/ML IJ SOLN: 6.2500 mg | INTRAMUSCULAR | Status: DC | PRN

## 2012-09-12 MED ORDER — 0.9 % SODIUM CHLORIDE (POUR BTL) OPTIME
TOPICAL | Status: DC | PRN
  Administered 2012-09-12: 1000 mL

## 2012-09-12 MED ORDER — HYDROMORPHONE HCL PF 1 MG/ML IJ SOLN
INTRAMUSCULAR | Status: DC | PRN
Start: 2012-09-12 — End: 2012-09-12
  Administered 2012-09-12: 1 mg via INTRAVENOUS

## 2012-09-12 MED ORDER — OXYCODONE-ACETAMINOPHEN 5-325 MG PO TABS: 1.0000 | ORAL_TABLET | ORAL | Status: DC | PRN

## 2012-09-12 MED ORDER — MIDAZOLAM HCL 2 MG/2ML IJ SOLN
INTRAMUSCULAR | Status: AC
  Filled 2012-09-12: qty 2

## 2012-09-12 MED ORDER — ONDANSETRON HCL 4 MG/2ML IJ SOLN
INTRAMUSCULAR | Status: DC | PRN
  Administered 2012-09-12: 4 mg via INTRAVENOUS

## 2012-09-12 MED ORDER — IBUPROFEN 600 MG PO TABS: 600.0000 mg | ORAL_TABLET | Freq: Three times a day (TID) | ORAL | Status: DC | PRN

## 2012-09-12 MED ORDER — GLYCOPYRROLATE 0.2 MG/ML IJ SOLN
INTRAMUSCULAR | Status: AC
  Filled 2012-09-12: qty 2

## 2012-09-12 MED ORDER — PROPOFOL 10 MG/ML IV EMUL
INTRAVENOUS | Status: DC | PRN
  Administered 2012-09-12: 250 mg via INTRAVENOUS

## 2012-09-12 MED ORDER — PROMETHAZINE HCL 25 MG/ML IJ SOLN: 6.2500 mg | INTRAMUSCULAR | Status: DC | PRN

## 2012-09-12 MED ORDER — FENTANYL CITRATE 0.05 MG/ML IJ SOLN
INTRAMUSCULAR | Status: AC
  Filled 2012-09-12: qty 2

## 2012-09-12 MED ORDER — FENTANYL CITRATE 0.05 MG/ML IJ SOLN
INTRAMUSCULAR | Status: AC
  Filled 2012-09-12: qty 5

## 2012-09-12 MED ORDER — KETOROLAC TROMETHAMINE 30 MG/ML IJ SOLN
INTRAMUSCULAR | Status: DC | PRN
  Administered 2012-09-12: 30 mg via INTRAVENOUS

## 2012-09-12 MED ORDER — MIDAZOLAM HCL 2 MG/2ML IJ SOLN
0.5000 mg | Freq: Once | INTRAMUSCULAR | Status: DC | PRN
Start: 2012-09-12 — End: 2012-09-12

## 2012-09-12 MED ORDER — GLYCOPYRROLATE 0.2 MG/ML IJ SOLN
INTRAMUSCULAR | Status: DC | PRN
  Administered 2012-09-12: .4 mg via INTRAVENOUS

## 2012-09-12 MED ORDER — BUPIVACAINE HCL (PF) 0.25 % IJ SOLN
INTRAMUSCULAR | Status: AC
  Filled 2012-09-12: qty 30

## 2012-09-12 MED ORDER — LACTATED RINGERS IV SOLN
INTRAVENOUS | Status: DC
  Administered 2012-09-12: 09:00:00 via INTRAVENOUS

## 2012-09-12 MED ORDER — KETOROLAC TROMETHAMINE 30 MG/ML IJ SOLN
INTRAMUSCULAR | Status: AC
  Filled 2012-09-12: qty 1

## 2012-09-12 MED ORDER — MIDAZOLAM HCL 5 MG/5ML IJ SOLN
INTRAMUSCULAR | Status: DC | PRN
  Administered 2012-09-12: 2 mg via INTRAVENOUS

## 2012-09-12 MED ORDER — LIDOCAINE HCL (CARDIAC) 20 MG/ML IV SOLN
INTRAVENOUS | Status: AC
  Filled 2012-09-12: qty 5

## 2012-09-12 MED ORDER — ROCURONIUM BROMIDE 100 MG/10ML IV SOLN
INTRAVENOUS | Status: DC | PRN
  Administered 2012-09-12: 50 mg via INTRAVENOUS

## 2012-09-12 MED ORDER — ROCURONIUM BROMIDE 50 MG/5ML IV SOLN
INTRAVENOUS | Status: AC
  Filled 2012-09-12: qty 1

## 2012-09-12 MED ORDER — LIDOCAINE HCL (CARDIAC) 20 MG/ML IV SOLN
INTRAVENOUS | Status: DC | PRN
  Administered 2012-09-12: 60 mg via INTRAVENOUS

## 2012-09-12 MED ORDER — HYDROMORPHONE HCL PF 1 MG/ML IJ SOLN
INTRAMUSCULAR | Status: AC
  Filled 2012-09-12: qty 1

## 2012-09-12 MED ORDER — FENTANYL CITRATE 0.05 MG/ML IJ SOLN
INTRAMUSCULAR | Status: DC | PRN
  Administered 2012-09-12: 50 ug via INTRAVENOUS
  Administered 2012-09-12 (×2): 100 ug via INTRAVENOUS

## 2012-09-12 MED ORDER — FENTANYL CITRATE 0.05 MG/ML IJ SOLN
25.0000 ug | INTRAMUSCULAR | Status: DC | PRN
  Administered 2012-09-12: 50 ug via INTRAVENOUS

## 2012-09-12 MED ORDER — NEOSTIGMINE METHYLSULFATE 1 MG/ML IJ SOLN
INTRAMUSCULAR | Status: AC
  Filled 2012-09-12: qty 1

## 2012-09-12 SURGICAL SUPPLY — 31 items
CABLE HIGH FREQUENCY MONO STRZ (ELECTRODE) IMPLANT
CHLORAPREP W/TINT 26ML (MISCELLANEOUS) ×3 IMPLANT
CLOTH BEACON ORANGE TIMEOUT ST (SAFETY) ×3 IMPLANT
DERMABOND ADVANCED (GAUZE/BANDAGES/DRESSINGS) ×1
DERMABOND ADVANCED .7 DNX12 (GAUZE/BANDAGES/DRESSINGS) ×2 IMPLANT
FORCEPS CUTTING 33CM 5MM (CUTTING FORCEPS) ×3 IMPLANT
FORCEPS CUTTING 45CM 5MM (CUTTING FORCEPS) IMPLANT
GLOVE BIO SURGEON STRL SZ7.5 (GLOVE) ×3 IMPLANT
GLOVE BIOGEL PI IND STRL 7.5 (GLOVE) ×2 IMPLANT
GLOVE BIOGEL PI INDICATOR 7.5 (GLOVE) ×1
GOWN PREVENTION PLUS LG XLONG (DISPOSABLE) ×3 IMPLANT
NEEDLE INSUFFLATION 120MM (ENDOMECHANICALS) ×3 IMPLANT
NS IRRIG 1000ML POUR BTL (IV SOLUTION) ×3 IMPLANT
PACK LAPAROSCOPY BASIN (CUSTOM PROCEDURE TRAY) ×3 IMPLANT
POUCH SPECIMEN RETRIEVAL 10MM (ENDOMECHANICALS) ×3 IMPLANT
PROTECTOR NERVE ULNAR (MISCELLANEOUS) ×3 IMPLANT
SET IRRIG TUBING LAPAROSCOPIC (IRRIGATION / IRRIGATOR) IMPLANT
SOLUTION ELECTROLUBE (MISCELLANEOUS) IMPLANT
STRIP CLOSURE SKIN 1/4X4 (GAUZE/BANDAGES/DRESSINGS) ×3 IMPLANT
SUT MON AB 4-0 PS1 27 (SUTURE) ×3 IMPLANT
SUT VICRYL 0 ENDOLOOP (SUTURE) IMPLANT
SUT VICRYL 0 TIES 12 18 (SUTURE) IMPLANT
SUT VICRYL 0 UR6 27IN ABS (SUTURE) ×3 IMPLANT
SYR 50ML LL SCALE MARK (SYRINGE) IMPLANT
TOWEL OR 17X24 6PK STRL BLUE (TOWEL DISPOSABLE) ×6 IMPLANT
TRAY FOLEY CATH 14FR (SET/KITS/TRAYS/PACK) ×3 IMPLANT
TROCAR XCEL NON-BLD 5MMX100MML (ENDOMECHANICALS) ×3 IMPLANT
TROCAR XCEL OPT SLVE 5M 100M (ENDOMECHANICALS) IMPLANT
TROCAR Z-THREAD FIOS 11X100 BL (TROCAR) ×3 IMPLANT
WARMER LAPAROSCOPE (MISCELLANEOUS) ×3 IMPLANT
WATER STERILE IRR 1000ML POUR (IV SOLUTION) ×3 IMPLANT

## 2012-09-12 NOTE — H&P (Signed)
Ruth Hale is a 47 y.o. female G2P2 who presents for laparoscopy because of pelvic pain and a left adnexal mass. For several months the patient reports intermittent pelvic pain- left greater than right, that is sometimes worse with going from sitting to standing and always painful with intercourse. Pain is described as a pulling/sharp pain that may last for 10 minutes at at time and keep her from sleeping. Pain is rated as an 8/10 on a 10 point pain scale with total relief from Vicodin (Ibuprofen has no effect on her pain). Denies bladder/bowel function changes or vaginitis symptoms. A pelvic ultrasound in December 2013 showed a uterus: 6.36 x 4.17 x 3.88 cm, left adnexal cystic structure lateral and inferior to left ovary measuring 2.2 x 1.4 x 1.5 cm ? small hydrosalpinx vs inclusion cyst otherwise ovaries appeared normal. A review of both medical and surgical management options were given to patient however, she wishes to proceed with surgical evaluation and management.   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; History of abnormal PAP smear treated with LEEP in 2012; Last PAP smear: Novermber 2013   Medical History: anemia, migraines, sarcoidosis (skin involvement only) and rheumatic fever   Surgical History: 2000 Tubal Sterilization and Tear Duct Surgery; 2008 Loop Electrosurgical Excision Procedure and 2012 Hysteroscopy, Dilation/Curettage and Hydrothermal Ablation  Denies problems with anesthesia or history of blood transfusions   Family History: stroke, breast cancer (mother age 33), cardiovascular disease, and thyroid disease   Social History: Single and employed as a Scientist, forensic; 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  diphenhydrAMINE (BENADRYL) 25 mg capsule  Take 25-50 mg by mouth daily as needed for allergies.     Marland Kitchen  HYDROcodone-acetaminophen (VICODIN) 5-500 MG per tablet  Take 1-2 tablets by mouth every 6 (six) hours as needed for pain.  30 tablet  0   .  hydroxychloroquine (PLAQUENIL) 200 MG tablet  Take 400 mg by mouth daily.     .  Multiple Vitamin (MULTIVITAMIN) tablet  Take 1 tablet by mouth daily.     Marland Kitchen  oxymetazoline (AFRIN) 0.05 % nasal spray  Place 2 sprays into the nose 2 (two) times daily as needed for congestion.     .  [DISCONTINUED] Hydrocodone-Acetaminophen 5-300 MG TABS  Take 5-300 mg by mouth every 4 (four) hours as needed.  30 each  0   .  [DISCONTINUED] tinidazole (TINDAMAX) 500 MG tablet  Take 4 tablets (2,000 mg total) by mouth daily. For 2 days.  8 tablet  0    No facility-administered encounter medications on file as of 08/31/2012.     No Known Allergies  Denies sensitivity to peanuts, shellfish, soy, latex or adhesives.    ROS: Admits to glasses and rash secondary to sarcoidosis but denies headache, vision changes, nasal congestion, dysphagia, tinnitus, dizziness, hoarseness, cough, chest pain, shortness of breath, nausea, vomiting, diarrhea,constipation, urinary frequency, urgency dysuria, hematuria, vaginitis symptoms, swelling of joints,easy bruising, myalgias, arthralgias, unexplained weight loss and except as is mentioned in the history of present illness, patient's review of systems is otherwise negative.    Physical Exam  BP 118/72  Pulse 78  Temp(Src) 98.1 F (36.7 C) (Oral)  Ht 5\' 7"  (  1.702 m)  Wt 265 lb (120.203 kg)  BMI 41.5 kg/m2  Neck: supple without masses or thyromegaly  Lungs: clear to auscultation  Heart: regular rate and rhythm  Abdomen: soft, non-tender and no organomegaly  Pelvic:EGBUS- wnl; vagina-normal rugae; uterus-normal size, cervix without lesions or motion tenderness; adnexae-no tenderness or masses  Extremities: no  clubbing, cyanosis or edema    Assesment: Pelvic Pain                       Left Adnexal Cystic Mass   Disposition: A discussion was held with patient regarding the indication for her procedure(s) along with the risks, which include but are not limited to: reaction to anesthesia, damage to adjacent organs, infection and excessive bleeding. The patient verbalized understanding of these risks an has consented to proceed with Operative Laparoscopy with Bilateral Salpingectomy at [facility] [place] on September 12, 2012 at 9:30 a.m.   CSN# [id number]  Kedron Uno J. Lowell Guitar, PA-C for Dr. Woodroe Mode. Su Hilt

## 2012-09-12 NOTE — Anesthesia Preprocedure Evaluation (Signed)
Anesthesia Evaluation  Patient identified by MRN, date of birth, ID band Patient awake    Reviewed: Allergy & Precautions, H&P , Patient's Chart, lab work & pertinent test results, reviewed documented beta blocker date and time   History of Anesthesia Complications Negative for: history of anesthetic complications  Airway Mallampati: III TM Distance: >3 FB Neck ROM: full    Dental no notable dental hx.    Pulmonary neg pulmonary ROS,  breath sounds clear to auscultation  Pulmonary exam normal       Cardiovascular Exercise Tolerance: Good negative cardio ROS  Rhythm:regular Rate:Normal     Neuro/Psych negative neurological ROS  negative psych ROS   GI/Hepatic negative GI ROS, Neg liver ROS, GERD-  Controlled,  Endo/Other  negative endocrine ROSMorbid obesity  Renal/GU negative Renal ROS     Musculoskeletal   Abdominal   Peds  Hematology negative hematology ROS (+) anemia ,   Anesthesia Other Findings Sarcoidosis     H/O: rheumatic fever        Vitamin D deficiency     Abnormal Pap smear 2008 LEEP     H/O varicella        Increased BMI 01/2011   Irregular periods/menstrual cycles 01/2011      ASCUS (atypical squamous cells of undetermined significance) on Pap smear 01/2011   High risk HPV infection 01/2011      H/O metrorrhagia 03/2011   Oligomenorrhea 03/2011      CIN I (cervical intraepithelial neoplasia I) 03/2011   Hx of migraines        HSV-2 (herpes simplex virus 2) infection     GERD (gastroesophageal reflux disease)   no meds currently    Headache     Anemia    Reproductive/Obstetrics negative OB ROS                           Anesthesia Physical Anesthesia Plan  ASA: III  Anesthesia Plan: General ETT   Post-op Pain Management:    Induction:   Airway Management Planned:   Additional Equipment:   Intra-op Plan:   Post-operative Plan:   Informed Consent: I have reviewed  the patients History and Physical, chart, labs and discussed the procedure including the risks, benefits and alternatives for the proposed anesthesia with the patient or authorized representative who has indicated his/her understanding and acceptance.   Dental Advisory Given  Plan Discussed with: CRNA and Surgeon  Anesthesia Plan Comments:         Anesthesia Quick Evaluation

## 2012-09-12 NOTE — Anesthesia Postprocedure Evaluation (Signed)
  Anesthesia Post-op Note  Anesthesia Post Note  Patient: Ruth Hale  Procedure(s) Performed: Procedure(s) (LRB): LAPAROSCOPY OPERATIVE (N/A) BILATERAL SALPINGECTOMY (Left)  Anesthesia type: General  Patient location: PACU  Post pain: Pain level controlled  Post assessment: Post-op Vital signs reviewed  Last Vitals:  Filed Vitals:   09/12/12 1330  BP: 110/59  Pulse:   Temp:   Resp: 18    Post vital signs: Reviewed  Level of consciousness: sedated  Complications: No apparent anesthesia complications

## 2012-09-12 NOTE — Transfer of Care (Signed)
Immediate Anesthesia Transfer of Care Note  Patient: Ruth Hale  Procedure(s) Performed: Procedure(s): LAPAROSCOPY OPERATIVE (N/A) BILATERAL SALPINGECTOMY (Left)  Patient Location: PACU  Anesthesia Type:General  Level of Consciousness: awake, alert  and oriented  Airway & Oxygen Therapy: Patient Spontanous Breathing and Patient connected to nasal cannula oxygen  Post-op Assessment: Report given to PACU RN and Post -op Vital signs reviewed and stable  Post vital signs: stable  Complications: No apparent anesthesia complications

## 2012-09-12 NOTE — H&P (Signed)
Agree with above.  R/B/A discussed.  Plan to proceed with laparoscopic bilateral salpingectomy.  Questions answered.

## 2012-09-12 NOTE — Op Note (Signed)
Preop Diagnosis: Left Pelvic Pain; 40981   Postop Diagnosis: Left Pelvic Pain; 19147   Procedure: LAPAROSCOPIC BILATERAL SALPINGECTOMY   Anesthesia: General    Attending: Purcell Nails, MD   Assistant:  Henreitta Leber, PA-C  Findings: Left paratubal cyst.  Normal bilateral tubes and ovaries.  Pathology: Bilateral fallopian tubes with left paratubal cyst.  Fluids: 1500 cc  UOP: 100 cc  EBL: Minimal  Complications: None  Procedure: The patient was taken to the operating room after the risks, benefits, alternatives, complications, treatment options, and expected outcomes were discussed with the patient. The patient verbalized understanding, the patient concurred with the proposed plan and consent signed and witnessed. The patient was taken to the Operating Room, identified as Destynie Toomey and the procedure verified as laparoscopic bilateral tubal fulguration. A Time Out was held and the above information confirmed.  The patient was placed under general anesthesia per anesthesia staff, the patient was placed in modified dorsal lithotomy position and was prepped, draped, and catheterized in the normal, sterile fashion.  The cervix was visualized and an intrauterine manipulator was placed.  A 10 mm umbilical incision was then performed. Veress needle was passed and pneumoperitoneum was established. A 10 mm trocar was advanced into the intraabdominal cavity, the laparoscope was introduced and findings as noted above.  Patient was placed in trendelenburg and marcaine injected in the suprapubic area and a 5 mm incision was made and 5 mm trocar advanced into the intraabdominal cavity.  The same was done in the LLQ.   The left fallopian tube was excised using the tripolar and the same was done on the contralateral side.  Both were placed in the endopouch and removed throught the 10mm port while watching with 5mm scope placed in LLQ port.  Bilateral pedicles were noted to be hemostatic.   Pneumoperitoneum was relieved while removing all trocars under direct visualization.  10mm fascial incision was repaired with 0 vicryl and skin reapproximated with 3-0 monocryl via subcuticular stitch.  All incisions were repaired with dermabond.  Foley and hulka intrauterine manipulator was removed.  Sponge lap and needle count was correct and patient was awaiting transfer to recovery room in good condition.

## 2012-09-13 ENCOUNTER — Encounter (HOSPITAL_COMMUNITY): Payer: Self-pay | Admitting: Obstetrics and Gynecology

## 2012-09-20 NOTE — Telephone Encounter (Signed)
Pt had procedure in March 2014

## 2013-04-12 ENCOUNTER — Other Ambulatory Visit: Payer: Self-pay

## 2013-04-12 DIAGNOSIS — Z1231 Encounter for screening mammogram for malignant neoplasm of breast: Secondary | ICD-10-CM

## 2013-05-10 ENCOUNTER — Ambulatory Visit
Admission: RE | Admit: 2013-05-10 | Discharge: 2013-05-10 | Disposition: A | Discharge status: Home / Self Care | Payer: Commercial Managed Care - PPO | Source: Ambulatory Visit

## 2013-05-10 DIAGNOSIS — Z1231 Encounter for screening mammogram for malignant neoplasm of breast: Secondary | ICD-10-CM

## 2013-12-04 ENCOUNTER — Other Ambulatory Visit: Payer: Self-pay | Admitting: Obstetrics and Gynecology

## 2014-04-22 ENCOUNTER — Encounter (HOSPITAL_COMMUNITY): Payer: Self-pay | Admitting: Obstetrics and Gynecology

## 2014-05-29 ENCOUNTER — Other Ambulatory Visit: Payer: Self-pay

## 2014-05-29 DIAGNOSIS — Z1231 Encounter for screening mammogram for malignant neoplasm of breast: Secondary | ICD-10-CM

## 2014-06-06 ENCOUNTER — Ambulatory Visit
Admission: RE | Admit: 2014-06-06 | Discharge: 2014-06-06 | Disposition: A | Discharge status: Home / Self Care | Payer: No Typology Code available for payment source | Source: Ambulatory Visit

## 2014-06-06 DIAGNOSIS — Z1231 Encounter for screening mammogram for malignant neoplasm of breast: Secondary | ICD-10-CM

## 2014-12-11 ENCOUNTER — Other Ambulatory Visit: Payer: Self-pay | Admitting: Obstetrics and Gynecology

## 2016-09-08 ENCOUNTER — Other Ambulatory Visit: Payer: Self-pay | Admitting: Obstetrics and Gynecology

## 2016-09-13 ENCOUNTER — Other Ambulatory Visit: Payer: Self-pay | Admitting: Family Medicine

## 2016-09-13 ENCOUNTER — Ambulatory Visit
Admission: RE | Admit: 2016-09-13 | Discharge: 2016-09-13 | Disposition: A | Discharge status: Home / Self Care | Payer: Commercial Managed Care - HMO | Source: Ambulatory Visit | Attending: Family Medicine | Admitting: Family Medicine

## 2016-09-13 DIAGNOSIS — M79672 Pain in left foot: Secondary | ICD-10-CM

## 2016-10-01 ENCOUNTER — Encounter: Payer: Self-pay | Admitting: Podiatry

## 2016-10-01 ENCOUNTER — Ambulatory Visit: Payer: Commercial Managed Care - HMO

## 2016-10-01 ENCOUNTER — Ambulatory Visit (INDEPENDENT_AMBULATORY_CARE_PROVIDER_SITE_OTHER): Payer: Commercial Managed Care - HMO | Admitting: Podiatry

## 2016-10-01 DIAGNOSIS — M722 Plantar fascial fibromatosis: Secondary | ICD-10-CM

## 2016-10-01 MED ORDER — DICLOFENAC SODIUM 75 MG PO TBEC: 75.0000 mg | DELAYED_RELEASE_TABLET | Freq: Two times a day (BID) | ORAL | 2 refills | Status: DC

## 2016-10-01 MED ORDER — TRIAMCINOLONE ACETONIDE 10 MG/ML IJ SUSP
10.0000 mg | Freq: Once | INTRAMUSCULAR | Status: AC
  Administered 2016-10-01: 10 mg

## 2016-10-01 NOTE — Patient Instructions (Signed)

## 2016-10-01 NOTE — Progress Notes (Signed)
   Subjective:    Patient ID: Ruth Hale, female    DOB: September 15, 1965, 51 y.o.   MRN: [id number]  HPI  Left heel pain for 2 months.    Review of Systems  Eyes: Positive for itching.  Neurological: Positive for facial asymmetry.  All other systems reviewed and are negative.      Objective:   Physical Exam        Assessment & Plan:

## 2016-10-03 NOTE — Progress Notes (Signed)
Subjective:     Patient ID: Ruth Hale, female   DOB: [birth year], 51 y.o.   MRN: [id number]  HPI patient points to left heel states it's been very sore and bothering her for approximately 3 months with worsening over the last couple. Patient states that she has trouble ambulating and has tried shoe gear modifications over-the-counter insoles and anti-inflammatories without relief of symptoms   Review of Systems  All other systems reviewed and are negative.      Objective:   Physical Exam  Constitutional: She is oriented to person, place, and time.  Cardiovascular: Intact distal pulses.   Musculoskeletal: Normal range of motion.  Neurological: She is oriented to person, place, and time.  Skin: Skin is warm.  Nursing note and vitals reviewed.  neurovascular status intact muscle strength adequate range of motion within normal limits with patient found to have exquisite discomfort plantar aspect left heel at the insertional point of the plantar fascia into the calcaneus. I did note edema in the medial side with moderate depression of the arch     Assessment:     Acute plantar fasciitis left heel    Plan:     H&P condition and x-rays reviewed with patient. Today I went ahead and injected the left plantar fascia 3 mg Kenalog 5 mg Xylocaine and applied fascial brace with instructions on usage. I then went ahead and I discussed physical therapy and placed on diclofenac 75 mg twice a day and reappoint 2 weeks  X-ray indicates spur with no indications of stress fracture arthritis

## 2016-10-15 ENCOUNTER — Ambulatory Visit (INDEPENDENT_AMBULATORY_CARE_PROVIDER_SITE_OTHER): Payer: Commercial Managed Care - HMO | Admitting: Podiatry

## 2016-10-15 DIAGNOSIS — M722 Plantar fascial fibromatosis: Secondary | ICD-10-CM | POA: Diagnosis not present

## 2016-10-18 NOTE — Progress Notes (Signed)
Subjective:    Patient ID: Ruth Hale, female   DOB: 51 y.o.   MRN: [id number]   HPI patient states it's getting better but I was having irritation from the brace    ROS      Objective:  Physical Exam Neurovascular status intact muscle strength adequate with patient's left heel improved but still pain when palpated around the medial fascial band    Assessment:     Doing well post fascial treatment left with fascial brace providing significant support therapy    Plan:    Advised patient on physical therapy anti-inflammatories and the wearing of supportive shoe gear. At this point explained how to wear the brace properly and patient will be seen back as needed

## 2016-11-16 NOTE — H&P (Signed)
Ruth Hale is a 51 y.o.  female,  P: 2-0-0-2 who presents for hysterectomy because of recurrent cervical dysplasia.  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  Her colposcopy results in January 2018 revealed, once again CIN-1.  The patient does not have a menstrual period due to having had an endometrial ablation in 2012 and no  bleeding since.  She further denies any changes in bowel or bladder function, dyspareunia nor lower back pain.  A pelvic ultrasound in March 2018 showed a retroverted uterus measuring 6.15 x 5.34 x 4.489 cm, endometrium: 9.55 mm with post ablation changes;   left ovary: 2.43 cm and right ovary: 2.55 cm. A review of both medical and surgical management options were given to the patient however the patient has decided to proceed with definitive therapy in the form of hysterectomy.   Past Medical History  OB History: G: 2;   P: 2-0-0-2;  C-section 1992 and SVB 1997 (6 lbs.)   GYN History: menarche: 51 YO   Contracepton bilateral tubal ligation  The patient reports a past history of: herpes and HPV.  Has  history of cervical dysplasia : CIN-1  with most recent colposcopy January 2018 revealing the same.  Medical History: GERD,  Anemia and Migraine  Surgical History:   Dilatation and Curettage, 2008 Tear Duct Surgery on Eyes,   2012 Endometrial Ablation and 2014 Bilateral Salpingectomy for Sterilization Denies problems with anesthesia or history of blood transfusions  Family History: Breast Cancer, Cardiovascular Disease, Stroke, Diabetes Mellitus and Thyroid Disease  Social History:  Divorced and employed as a Engineer, siteMedical Assistant;  Denies tobacco use and occasionally uses alcohol  Medications:  Diclofenac 75 mg  twice daily as needed Levocetirizine 5 mg  daily Montelukast 10 mg daily Valacyclovir 500 mg daily Zolpidem 10 mg qhs as needed  No Known Allergies  ROS: Admits to glasses but  denies headache, vision changes,  nasal congestion, dysphagia, tinnitus, dizziness, hoarseness, cough,  chest pain, shortness of breath, nausea, vomiting, diarrhea,constipation,  urinary frequency, urgency  dysuria, hematuria, vaginitis symptoms, pelvic pain, swelling of joints,easy bruising,  myalgias, arthralgias, skin rashes, unexplained weight loss and except as is mentioned in the history of present illness, patient's review of systems is otherwise negative.     Physical Exam  Bp: 144/90   P: 64 bpm   R: 16   Temperature: 98.9 degrees F orally     Weight: 279 lbs.  Height: 5\' 6"   BMI: 45  Neck: supple without masses or thyromegaly Lungs: clear to auscultation Heart: regular rate and rhythm Abdomen: soft, non-tender and no organomegaly Pelvic:EGBUS- wnl; vagina-normal rugae; uterus-normal size, cervix without lesions or motion tenderness; adnexae-no tenderness or masses Extremities:  no clubbing, cyanosis or edema   Assesment:  Persistent Cervical Dysplasia   Disposition:  A discussion was held with patient regarding the indication for her procedure(s) along with the risks, which include but are not limited to: reaction to anesthesia, damage to adjacent organs, infection, excessive bleeding and possible open abdominal incision. The patient verbalized understanding of these risks and has consented to proceed with a Total Vaginal Hysterectomy with Possible Cystoscopy, possible laparoscopically assisted vaginal hysterectomy and possible [facility] November [facility], 2016.   CSN# [id number]   [place] J. Lowell GuitarPowell, PA-C  for Dr. Woodroe ModeAngela Y. Su Hiltoberts

## 2016-11-22 NOTE — Patient Instructions (Addendum)
Your procedure is scheduled on:  Monday, November 29, 2016  [facility] at:  7:00 AM  Pick up the phone at the desk and dial [phone].  Call this number if you have problems the morning of surgery: [phone].  Remember:  Do NOT eat food or drink after:  Midnight Sunday  Take these medicines the morning of surgery with a SIP OF WATER:  None  Stop ALL herbal medications at this time  Do NOT smoke the day of surgery.  Do NOT wear jewelry (body piercing), metal hair clips/bobby pins, make-up, or nail polish. Do NOT wear lotions, powders, or perfumes.  You may wear deodorant. Do NOT shave for 48 hours prior to surgery. Do NOT bring valuables to the hospital. Contacts, dentures, or bridgework may not be worn into surgery.  Leave suitcase in car.  After surgery it may be brought to your room.  For patients admitted to the hospital, checkout time is 11:00 AM the day of discharge.  Bring a copy of your healthcare power of attorney and living will documents.

## 2016-11-23 ENCOUNTER — Encounter (HOSPITAL_COMMUNITY)
Admission: RE | Admit: 2016-11-23 | Discharge: 2016-11-23 | Disposition: A | Discharge status: Home / Self Care | Payer: Commercial Managed Care - HMO | Source: Ambulatory Visit | Attending: Obstetrics and Gynecology | Admitting: Obstetrics and Gynecology

## 2016-11-23 ENCOUNTER — Encounter (HOSPITAL_COMMUNITY): Payer: Self-pay

## 2016-11-23 DIAGNOSIS — N870 Mild cervical dysplasia: Secondary | ICD-10-CM | POA: Insufficient documentation

## 2016-11-23 HISTORY — DX: Bronchitis, not specified as acute or chronic: J40

## 2016-11-23 HISTORY — DX: Cardiac murmur, unspecified: R01.1

## 2016-11-23 HISTORY — DX: Personal history of urinary calculi: Z87.442

## 2016-11-23 LAB — CBC
HEMATOCRIT: 40.4 % (ref 36.0–46.0)
HEMOGLOBIN: 12.9 g/dL (ref 12.0–15.0)
MCH: 27.2 pg (ref 26.0–34.0)
MCHC: 31.9 g/dL (ref 30.0–36.0)
MCV: 85.2 fL (ref 78.0–100.0)
PLATELETS: 242 10*3/uL (ref 150–400)
RBC: 4.74 MIL/uL (ref 3.87–5.11)
RDW: 14.8 % (ref 11.5–15.5)
WBC: 4 10*3/uL (ref 4.0–10.5)

## 2016-11-23 LAB — BASIC METABOLIC PANEL
ANION GAP: 8 (ref 5–15)
BUN: 13 mg/dL (ref 6–20)
CHLORIDE: 105 mmol/L (ref 101–111)
CO2: 25 mmol/L (ref 22–32)
Calcium: 9.1 mg/dL (ref 8.9–10.3)
Creatinine, Ser: 1.08 mg/dL — ABNORMAL HIGH (ref 0.44–1.00)
GFR calc non Af Amer: 59 mL/min — ABNORMAL LOW (ref >60)
Glucose, Bld: 100 mg/dL — ABNORMAL HIGH (ref 65–99)
POTASSIUM: 3.6 mmol/L (ref 3.5–5.1)
SODIUM: 138 mmol/L (ref 135–145)

## 2016-11-23 NOTE — Pre-Procedure Instructions (Signed)
Dr. Desmond Lopeurk made aware of Ruth Hale's elevated blood pressure. He recommends she see her primary care physician prior to surgery.  Adrianne made aware at Dr. Su Hiltoberts' office and will follow up with Ruth Hale.

## 2016-11-28 NOTE — Anesthesia Preprocedure Evaluation (Signed)
Anesthesia Evaluation  Patient identified by MRN, date of birth, ID band Patient awake    Reviewed: Allergy & Precautions, H&P , Patient's Chart, lab work & pertinent test results, reviewed documented beta blocker date and time   History of Anesthesia Complications Negative for: history of anesthetic complications  Airway Mallampati: III  TM Distance: >3 FB Neck ROM: full    Dental no notable dental hx.    Pulmonary neg pulmonary ROS,    Pulmonary exam normal breath sounds clear to auscultation       Cardiovascular Exercise Tolerance: Good negative cardio ROS   Rhythm:regular Rate:Normal     Neuro/Psych negative neurological ROS  negative psych ROS   GI/Hepatic negative GI ROS, Neg liver ROS, GERD  Controlled,  Endo/Other  negative endocrine ROSMorbid obesity  Renal/GU negative Renal ROS     Musculoskeletal   Abdominal   Peds  Hematology negative hematology ROS (+) anemia ,   Anesthesia Other Findings Sarcoidosis  Morbid obesity H/O: rheumatic fever    Used wedge for optimal airway position last time        Reproductive/Obstetrics negative OB ROS                             Anesthesia Physical  Anesthesia Plan  ASA: III  Anesthesia Plan: General ETT   Post-op Pain Management:    Induction:   PONV Risk Score and Plan:   Airway Management Planned:   Additional Equipment:   Intra-op Plan:   Post-operative Plan:   Informed Consent: I have reviewed the patients History and Physical, chart, labs and discussed the procedure including the risks, benefits and alternatives for the proposed anesthesia with the patient or authorized representative who has indicated his/her understanding and acceptance.   Dental Advisory Given  Plan Discussed with: CRNA and Surgeon  Anesthesia Plan Comments:         Anesthesia Quick Evaluation

## 2016-11-29 ENCOUNTER — Encounter (HOSPITAL_COMMUNITY): Payer: Self-pay | Admitting: *Deleted

## 2016-11-29 ENCOUNTER — Observation Stay (HOSPITAL_COMMUNITY): Payer: Commercial Managed Care - HMO

## 2016-11-29 ENCOUNTER — Observation Stay (HOSPITAL_COMMUNITY)
Admission: RE | Admit: 2016-11-29 | Discharge: 2016-11-30 | Disposition: A | Discharge status: Home / Self Care | Payer: Commercial Managed Care - HMO | Source: Ambulatory Visit | Attending: Obstetrics and Gynecology | Admitting: Obstetrics and Gynecology

## 2016-11-29 ENCOUNTER — Encounter (HOSPITAL_COMMUNITY)
Admission: RE | Disposition: A | Discharge status: Home / Self Care | Payer: Self-pay | Source: Ambulatory Visit | Attending: Obstetrics and Gynecology

## 2016-11-29 ENCOUNTER — Ambulatory Visit (HOSPITAL_COMMUNITY): Payer: Commercial Managed Care - HMO | Admitting: Anesthesiology

## 2016-11-29 DIAGNOSIS — N879 Dysplasia of cervix uteri, unspecified: Secondary | ICD-10-CM | POA: Diagnosis present

## 2016-11-29 DIAGNOSIS — Z79899 Other long term (current) drug therapy: Secondary | ICD-10-CM | POA: Insufficient documentation

## 2016-11-29 DIAGNOSIS — N800 Endometriosis of uterus: Secondary | ICD-10-CM | POA: Insufficient documentation

## 2016-11-29 DIAGNOSIS — K219 Gastro-esophageal reflux disease without esophagitis: Secondary | ICD-10-CM | POA: Insufficient documentation

## 2016-11-29 DIAGNOSIS — N870 Mild cervical dysplasia: Principal | ICD-10-CM | POA: Insufficient documentation

## 2016-11-29 DIAGNOSIS — Z6841 Body Mass Index (BMI) 40.0 and over, adult: Secondary | ICD-10-CM | POA: Diagnosis not present

## 2016-11-29 DIAGNOSIS — Z09 Encounter for follow-up examination after completed treatment for conditions other than malignant neoplasm: Secondary | ICD-10-CM

## 2016-11-29 DIAGNOSIS — N72 Inflammatory disease of cervix uteri: Secondary | ICD-10-CM | POA: Insufficient documentation

## 2016-11-29 DIAGNOSIS — D649 Anemia, unspecified: Secondary | ICD-10-CM | POA: Insufficient documentation

## 2016-11-29 HISTORY — PX: CYSTOSCOPY: SHX5120

## 2016-11-29 HISTORY — PX: VAGINAL HYSTERECTOMY: SHX2639

## 2016-11-29 LAB — TYPE AND SCREEN
ABO/RH(D): B POS
Antibody Screen: NEGATIVE

## 2016-11-29 LAB — CBC
HCT: 43.6 % (ref 36.0–46.0)
HCT: 40.8 % (ref 36.0–46.0)
HEMOGLOBIN: 13.8 g/dL (ref 12.0–15.0)
Hemoglobin: 12.9 g/dL (ref 12.0–15.0)
MCH: 27 pg (ref 26.0–34.0)
MCH: 27.1 pg (ref 26.0–34.0)
MCHC: 31.7 g/dL (ref 30.0–36.0)
MCHC: 31.6 g/dL (ref 30.0–36.0)
MCV: 85.2 fL (ref 78.0–100.0)
MCV: 85.7 fL (ref 78.0–100.0)
PLATELETS: 271 10*3/uL (ref 150–400)
Platelets: 242 10*3/uL (ref 150–400)
RBC: 5.12 MIL/uL — ABNORMAL HIGH (ref 3.87–5.11)
RBC: 4.76 MIL/uL (ref 3.87–5.11)
RDW: 14.8 % (ref 11.5–15.5)
RDW: 14.7 % (ref 11.5–15.5)
WBC: 5.3 10*3/uL (ref 4.0–10.5)
WBC: 7.7 10*3/uL (ref 4.0–10.5)

## 2016-11-29 LAB — PREGNANCY, URINE: PREG TEST UR: NEGATIVE

## 2016-11-29 LAB — ABO/RH: ABO/RH(D): B POS

## 2016-11-29 SURGERY — HYSTERECTOMY, VAGINAL
Anesthesia: General

## 2016-11-29 MED ORDER — NALOXONE HCL 0.4 MG/ML IJ SOLN: 0.4000 mg | INTRAMUSCULAR | Status: DC | PRN

## 2016-11-29 MED ORDER — SCOPOLAMINE 1 MG/3DAYS TD PT72
1.0000 | MEDICATED_PATCH | Freq: Once | TRANSDERMAL | Status: DC
  Administered 2016-11-29: 1.5 mg via TRANSDERMAL

## 2016-11-29 MED ORDER — SCOPOLAMINE 1 MG/3DAYS TD PT72
MEDICATED_PATCH | TRANSDERMAL | Status: AC
  Administered 2016-11-29: 1.5 mg via TRANSDERMAL
  Filled 2016-11-29: qty 1

## 2016-11-29 MED ORDER — VASOPRESSIN 20 UNIT/ML IJ SOLN
INTRAMUSCULAR | Status: DC | PRN
  Administered 2016-11-29: 40 [IU]

## 2016-11-29 MED ORDER — FAMOTIDINE 20 MG PO TABS
ORAL_TABLET | ORAL | Status: AC
  Administered 2016-11-29: 20 mg via ORAL
  Filled 2016-11-29: qty 1

## 2016-11-29 MED ORDER — LACTATED RINGERS IV SOLN
INTRAVENOUS | Status: DC
  Administered 2016-11-29 (×3): via INTRAVENOUS

## 2016-11-29 MED ORDER — PROPOFOL 10 MG/ML IV BOLUS
INTRAVENOUS | Status: DC | PRN
  Administered 2016-11-29: 180 mg via INTRAVENOUS

## 2016-11-29 MED ORDER — VASOPRESSIN 20 UNIT/ML IV SOLN
INTRAVENOUS | Status: AC
  Filled 2016-11-29: qty 2

## 2016-11-29 MED ORDER — ONDANSETRON HCL 4 MG/2ML IJ SOLN
INTRAMUSCULAR | Status: AC
  Filled 2016-11-29: qty 2

## 2016-11-29 MED ORDER — ONDANSETRON HCL 4 MG/2ML IJ SOLN
4.0000 mg | Freq: Four times a day (QID) | INTRAMUSCULAR | Status: DC | PRN
  Administered 2016-11-29: 4 mg via INTRAVENOUS
  Filled 2016-11-29: qty 2

## 2016-11-29 MED ORDER — DEXAMETHASONE SODIUM PHOSPHATE 10 MG/ML IJ SOLN
INTRAMUSCULAR | Status: AC
  Filled 2016-11-29: qty 1

## 2016-11-29 MED ORDER — SODIUM CHLORIDE 0.9 % IJ SOLN
INTRAMUSCULAR | Status: DC | PRN
  Administered 2016-11-29: 100 mL via INTRAVENOUS

## 2016-11-29 MED ORDER — DEXAMETHASONE SODIUM PHOSPHATE 10 MG/ML IJ SOLN
INTRAMUSCULAR | Status: DC | PRN
Start: 2016-11-29 — End: 2016-11-29
  Administered 2016-11-29: 10 mg via INTRAVENOUS

## 2016-11-29 MED ORDER — PANTOPRAZOLE SODIUM 40 MG PO TBEC
DELAYED_RELEASE_TABLET | ORAL | Status: AC
Start: 2016-11-29 — End: 2016-11-29
  Filled 2016-11-29: qty 1

## 2016-11-29 MED ORDER — ALBUTEROL SULFATE (2.5 MG/3ML) 0.083% IN NEBU: 3.0000 mL | INHALATION_SOLUTION | RESPIRATORY_TRACT | Status: DC | PRN

## 2016-11-29 MED ORDER — HYDROMORPHONE HCL 1 MG/ML IJ SOLN
INTRAMUSCULAR | Status: DC | PRN
  Administered 2016-11-29: 0.5 mg via INTRAVENOUS

## 2016-11-29 MED ORDER — ONDANSETRON HCL 4 MG PO TABS: 4.0000 mg | ORAL_TABLET | Freq: Three times a day (TID) | ORAL | Status: DC | PRN

## 2016-11-29 MED ORDER — ACETAMINOPHEN 160 MG/5ML PO SOLN
975.0000 mg | Freq: Once | ORAL | Status: AC
  Administered 2016-11-29: 975 mg via ORAL

## 2016-11-29 MED ORDER — METOCLOPRAMIDE HCL 10 MG PO TABS
ORAL_TABLET | ORAL | Status: AC
  Administered 2016-11-29: 10 mg via ORAL
  Filled 2016-11-29: qty 1

## 2016-11-29 MED ORDER — OXYCODONE-ACETAMINOPHEN 5-325 MG PO TABS: 1.0000 | ORAL_TABLET | ORAL | Status: DC | PRN

## 2016-11-29 MED ORDER — PHENYLEPHRINE HCL 10 MG/ML IJ SOLN
INTRAMUSCULAR | Status: AC
  Filled 2016-11-29: qty 1

## 2016-11-29 MED ORDER — ROCURONIUM BROMIDE 100 MG/10ML IV SOLN
INTRAVENOUS | Status: DC | PRN
  Administered 2016-11-29: 10 mg via INTRAVENOUS
  Administered 2016-11-29: 40 mg via INTRAVENOUS

## 2016-11-29 MED ORDER — METOCLOPRAMIDE HCL 10 MG PO TABS
10.0000 mg | ORAL_TABLET | Freq: Once | ORAL | Status: AC | PRN
  Administered 2016-11-29: 10 mg via ORAL

## 2016-11-29 MED ORDER — LACTATED RINGERS IV SOLN
INTRAVENOUS | Status: DC
  Administered 2016-11-29: 14:00:00 via INTRAVENOUS

## 2016-11-29 MED ORDER — FENTANYL CITRATE (PF) 100 MCG/2ML IJ SOLN
INTRAMUSCULAR | Status: AC
  Filled 2016-11-29: qty 2

## 2016-11-29 MED ORDER — IBUPROFEN 600 MG PO TABS
600.0000 mg | ORAL_TABLET | Freq: Four times a day (QID) | ORAL | Status: DC | PRN
  Administered 2016-11-30: 600 mg via ORAL
  Filled 2016-11-29: qty 1

## 2016-11-29 MED ORDER — ROCURONIUM BROMIDE 100 MG/10ML IV SOLN
INTRAVENOUS | Status: AC
  Filled 2016-11-29: qty 1

## 2016-11-29 MED ORDER — FENTANYL CITRATE (PF) 250 MCG/5ML IJ SOLN
INTRAMUSCULAR | Status: AC
  Filled 2016-11-29: qty 5

## 2016-11-29 MED ORDER — LIDOCAINE HCL (CARDIAC) 20 MG/ML IV SOLN
INTRAVENOUS | Status: DC | PRN
  Administered 2016-11-29: 80 mg via INTRAVENOUS

## 2016-11-29 MED ORDER — SUGAMMADEX SODIUM 200 MG/2ML IV SOLN
INTRAVENOUS | Status: DC | PRN
  Administered 2016-11-29: 200 mg via INTRAVENOUS

## 2016-11-29 MED ORDER — HYDROMORPHONE 1 MG/ML IV SOLN
INTRAVENOUS | Status: DC
  Administered 2016-11-29: 4.5 mg via INTRAVENOUS
  Administered 2016-11-29: 0.6 mg via INTRAVENOUS
  Administered 2016-11-29: 14:00:00 via INTRAVENOUS
  Administered 2016-11-29: 4.2 mL via INTRAVENOUS
  Filled 2016-11-29: qty 25

## 2016-11-29 MED ORDER — CEFAZOLIN SODIUM 10 G IJ SOLR
3.0000 g | INTRAMUSCULAR | Status: AC
  Administered 2016-11-29: 3 g via INTRAVENOUS
  Filled 2016-11-29: qty 3000

## 2016-11-29 MED ORDER — ONDANSETRON HCL 4 MG/2ML IJ SOLN
INTRAMUSCULAR | Status: DC | PRN
  Administered 2016-11-29: 4 mg via INTRAVENOUS

## 2016-11-29 MED ORDER — MIDAZOLAM HCL 2 MG/2ML IJ SOLN
INTRAMUSCULAR | Status: DC | PRN
  Administered 2016-11-29: 2 mg via INTRAVENOUS

## 2016-11-29 MED ORDER — SCOPOLAMINE 1 MG/3DAYS TD PT72: 1.0000 | MEDICATED_PATCH | Freq: Once | TRANSDERMAL | Status: DC | PRN

## 2016-11-29 MED ORDER — AMLODIPINE BESYLATE 10 MG PO TABS
10.0000 mg | ORAL_TABLET | Freq: Every day | ORAL | Status: DC
  Administered 2016-11-29: 10 mg via ORAL
  Filled 2016-11-29: qty 1

## 2016-11-29 MED ORDER — DIPHENHYDRAMINE HCL 12.5 MG/5ML PO ELIX: 12.5000 mg | ORAL_SOLUTION | Freq: Four times a day (QID) | ORAL | Status: DC | PRN

## 2016-11-29 MED ORDER — LACTATED RINGERS IV SOLN: INTRAVENOUS | Status: DC

## 2016-11-29 MED ORDER — PROPOFOL 10 MG/ML IV BOLUS
INTRAVENOUS | Status: AC
  Filled 2016-11-29: qty 20

## 2016-11-29 MED ORDER — PANTOPRAZOLE SODIUM 40 MG PO TBEC
40.0000 mg | DELAYED_RELEASE_TABLET | Freq: Once | ORAL | Status: AC
  Administered 2016-11-29: 40 mg via ORAL

## 2016-11-29 MED ORDER — DIPHENHYDRAMINE HCL 50 MG/ML IJ SOLN: 12.5000 mg | Freq: Four times a day (QID) | INTRAMUSCULAR | Status: DC | PRN

## 2016-11-29 MED ORDER — LIDOCAINE HCL (CARDIAC) 20 MG/ML IV SOLN
INTRAVENOUS | Status: AC
  Filled 2016-11-29: qty 5

## 2016-11-29 MED ORDER — ONDANSETRON HCL 4 MG/2ML IJ SOLN
4.0000 mg | Freq: Once | INTRAMUSCULAR | Status: AC
  Administered 2016-11-29: 4 mg via INTRAVENOUS

## 2016-11-29 MED ORDER — FAMOTIDINE 20 MG PO TABS
20.0000 mg | ORAL_TABLET | Freq: Once | ORAL | Status: AC | PRN
  Administered 2016-11-29: 20 mg via ORAL

## 2016-11-29 MED ORDER — FENTANYL CITRATE (PF) 100 MCG/2ML IJ SOLN
25.0000 ug | INTRAMUSCULAR | Status: DC | PRN
  Administered 2016-11-29: 25 ug via INTRAVENOUS

## 2016-11-29 MED ORDER — STERILE WATER FOR IRRIGATION IR SOLN
Status: DC | PRN
  Administered 2016-11-29: 1000 mL

## 2016-11-29 MED ORDER — MIDAZOLAM HCL 2 MG/2ML IJ SOLN
INTRAMUSCULAR | Status: AC
  Filled 2016-11-29: qty 2

## 2016-11-29 MED ORDER — PHENYLEPHRINE HCL 10 MG/ML IJ SOLN
INTRAMUSCULAR | Status: DC | PRN
  Administered 2016-11-29 (×4): 80 ug via INTRAVENOUS
  Administered 2016-11-29: 40 ug via INTRAVENOUS

## 2016-11-29 MED ORDER — DOCUSATE SODIUM 100 MG PO CAPS
100.0000 mg | ORAL_CAPSULE | Freq: Two times a day (BID) | ORAL | Status: DC
  Administered 2016-11-29 – 2016-11-30 (×2): 100 mg via ORAL
  Filled 2016-11-29 (×2): qty 1

## 2016-11-29 MED ORDER — FENTANYL CITRATE (PF) 100 MCG/2ML IJ SOLN
INTRAMUSCULAR | Status: DC | PRN
  Administered 2016-11-29: 100 ug via INTRAVENOUS
  Administered 2016-11-29: 50 ug via INTRAVENOUS
  Administered 2016-11-29: 100 ug via INTRAVENOUS

## 2016-11-29 MED ORDER — MONTELUKAST SODIUM 10 MG PO TABS
10.0000 mg | ORAL_TABLET | Freq: Every day | ORAL | Status: DC
  Administered 2016-11-29: 10 mg via ORAL
  Filled 2016-11-29 (×2): qty 1

## 2016-11-29 MED ORDER — SUGAMMADEX SODIUM 200 MG/2ML IV SOLN
INTRAVENOUS | Status: AC
  Filled 2016-11-29: qty 2

## 2016-11-29 MED ORDER — MENTHOL 3 MG MT LOZG: 1.0000 | LOZENGE | OROMUCOSAL | Status: DC | PRN

## 2016-11-29 MED ORDER — ACETAMINOPHEN 160 MG/5ML PO SOLN
ORAL | Status: AC
  Administered 2016-11-29: 975 mg via ORAL
  Filled 2016-11-29: qty 40.6

## 2016-11-29 MED ORDER — SODIUM CHLORIDE 0.9% FLUSH: 9.0000 mL | INTRAVENOUS | Status: DC | PRN

## 2016-11-29 MED ORDER — HYDROMORPHONE HCL 1 MG/ML IJ SOLN
INTRAMUSCULAR | Status: AC
  Filled 2016-11-29: qty 1

## 2016-11-29 SURGICAL SUPPLY — 36 items
BAG DECANTER FOR FLEXI CONT (MISCELLANEOUS) ×4 IMPLANT
CANISTER SUCT 3000ML PPV (MISCELLANEOUS) ×2 IMPLANT
CLOTH BEACON ORANGE TIMEOUT ST (SAFETY) ×2 IMPLANT
CONT PATH 16OZ SNAP LID 3702 (MISCELLANEOUS) IMPLANT
DECANTER SPIKE VIAL GLASS SM (MISCELLANEOUS) IMPLANT
DRAPE SHEET LG 3/4 BI-LAMINATE (DRAPES) ×4 IMPLANT
DRAPE STERI URO 9X17 APER PCH (DRAPES) ×2 IMPLANT
GLOVE BIO SURGEON STRL SZ7.5 (GLOVE) ×2 IMPLANT
GLOVE BIOGEL PI IND STRL 6.5 (GLOVE) ×1 IMPLANT
GLOVE BIOGEL PI IND STRL 7.0 (GLOVE) ×1 IMPLANT
GLOVE BIOGEL PI IND STRL 7.5 (GLOVE) ×2 IMPLANT
GLOVE BIOGEL PI INDICATOR 6.5 (GLOVE) ×1
GLOVE BIOGEL PI INDICATOR 7.0 (GLOVE) ×1
GLOVE BIOGEL PI INDICATOR 7.5 (GLOVE) ×2
GOWN STRL REUS W/TWL LRG LVL3 (GOWN DISPOSABLE) ×8 IMPLANT
NEEDLE HYPO 22GX1.5 SAFETY (NEEDLE) IMPLANT
NEEDLE MAYO CATGUT SZ4 (NEEDLE) IMPLANT
NS IRRIG 1000ML POUR BTL (IV SOLUTION) ×2 IMPLANT
PACK TRENDGUARD 600 HYBRD PROC (MISCELLANEOUS) IMPLANT
PACK VAGINAL WOMENS (CUSTOM PROCEDURE TRAY) ×2 IMPLANT
PAD OB MATERNITY 4.3X12.25 (PERSONAL CARE ITEMS) ×2 IMPLANT
SET CYSTO W/LG BORE CLAMP LF (SET/KITS/TRAYS/PACK) ×2 IMPLANT
SUT VIC AB 0 CT1 18XCR BRD8 (SUTURE) ×3 IMPLANT
SUT VIC AB 0 CT1 27 (SUTURE)
SUT VIC AB 0 CT1 27XBRD ANBCTR (SUTURE) IMPLANT
SUT VIC AB 0 CT1 8-18 (SUTURE) ×3
SUT VIC AB 2-0 CT1 27 (SUTURE) ×1
SUT VIC AB 2-0 CT1 TAPERPNT 27 (SUTURE) ×1 IMPLANT
SUT VIC AB 2-0 SH 27 (SUTURE) ×2
SUT VIC AB 2-0 SH 27XBRD (SUTURE) ×2 IMPLANT
SUT VICRYL 0 TIES 12 18 (SUTURE) ×2 IMPLANT
SYR BULB IRRIGATION 50ML (SYRINGE) ×2 IMPLANT
SYR TB 1ML 25GX5/8 (SYRINGE) IMPLANT
TOWEL OR 17X24 6PK STRL BLUE (TOWEL DISPOSABLE) ×4 IMPLANT
TRAY FOLEY CATH SILVER 14FR (SET/KITS/TRAYS/PACK) ×2 IMPLANT
TRENDGUARD 600 HYBRID PROC PK (MISCELLANEOUS)

## 2016-11-29 NOTE — Transfer of Care (Signed)
Immediate Anesthesia Transfer of Care Note  Patient: Ruth Hale  Procedure(s) Performed: Procedure(s): HYSTERECTOMY VAGINAL (N/A) CYSTOSCOPY (N/A)  Patient Location: PACU  Anesthesia Type:General  Level of Consciousness: sedated  Airway & Oxygen Therapy: Patient Spontanous Breathing and Patient connected to nasal cannula oxygen  Post-op Assessment: Report given to RN  Post vital signs: Reviewed and stable  Last Vitals:  Vitals:   11/29/16 0709  BP: (!) 146/82  Pulse: (!) 103  Resp: 16  Temp: 36.7 C    Last Pain:  Vitals:   11/29/16 0709  TempSrc: Oral      Patients Stated Pain Goal: 5 (11/29/16 0709)  Complications: No apparent anesthesia complications

## 2016-11-29 NOTE — Discharge Instructions (Signed)
Call [facility] @ [phone] if: ° °You have a temperature greater than or equal to 100.4 degrees Farenheit orally °You have pain that is not made better by the pain medication given and taken as directed °You have excessive bleeding or problems urinating ° °Take Colace (Docusate Sodium/Stool Softener) 100 mg 2-3 times daily while taking narcotic pain medicine to avoid constipation or until bowel movements are regular. °Take Ibuprofen 600 mg with food every 6 hours for 5 days then as needed for pain ° °You may drive after 2  weeks °You may walk up steps ° °You may shower  °You may resume a regular diet ° °Do not lift over 15 pounds for 6 weeks °Avoid anything in vagina for 6 weeks (or until after your post-operative visit) ° °

## 2016-11-29 NOTE — Interval H&P Note (Signed)
History and Physical Interval Note:  11/29/2016 8:26 AM  Ruth Hale  has presented today for surgery, with the diagnosis of Cervical Intraepithelial Neoplasia Grade 1  The various methods of treatment have been discussed with the patient and family. After consideration of risks, benefits and other options for treatment, the patient has consented to  Procedure(s): HYSTERECTOMY VAGINAL (N/A)  POSSIBLE LAPAROSCOPICALLY ASSISTED VAGINAL HYSTERECTOMY AND POSSIBLE LAPAROTOMY?ABDOMINAL HYSTERECTOMY, POSSIBLE CYSTOSCOPY (Bilateral) as a surgical intervention .  The patient's history has been reviewed, patient examined, no change in status, stable for surgery.  I have reviewed the patient's chart and labs.  Questions were answered to the patient's satisfaction.     Purcell NailsOBERTS,Malak Duchesneau Y

## 2016-11-29 NOTE — OR Nursing (Signed)
Dr. Bradly ChrisStroud called with x-ray results at 1650.  Dr. Bradly ChrisStroud states that the x-ray is clear, no suture or metallic objects seen.

## 2016-11-29 NOTE — Anesthesia Procedure Notes (Signed)
Procedure Name: Intubation Date/Time: 11/29/2016 8:42 AM Performed by: Casimer Lanius A Pre-anesthesia Checklist: Patient identified, Emergency Drugs available, Suction available and Patient being monitored Patient Re-evaluated:Patient Re-evaluated prior to inductionOxygen Delivery Method: Circle system utilized and Simple face mask Preoxygenation: Pre-oxygenation with 100% oxygen Intubation Type: IV induction, Inhalational induction and Cricoid Pressure applied Ventilation: Mask ventilation without difficulty Laryngoscope Size: Mac and 3 Grade View: Grade II Tube type: Oral Tube size: 7.0 mm Number of attempts: 1 Airway Equipment and Method: Stylet and Patient positioned with wedge pillow Placement Confirmation: ETT inserted through vocal cords under direct vision,  positive ETCO2 and breath sounds checked- equal and bilateral Secured at: 20 (right lip) cm Tube secured with: Tape Dental Injury: Teeth and Oropharynx as per pre-operative assessment

## 2016-11-29 NOTE — Progress Notes (Signed)
Day of Surgery Procedure(s) (LRB): HYSTERECTOMY VAGINAL (N/A) CYSTOSCOPY (N/A)  Subjective: Patient reports no problems.  She had a little nausea earlier but it was relieved with medicine.      Objective: I have reviewed patient's vital signs and intake and output. UOP 900/4.5hrs  General: alert and no distress Resp: clear to auscultation bilaterally Cardio: regular rate and rhythm GI: soft, NT, ND, decreased bowel sounds Extremities: no calf tenderness, SCDs are on Vaginal Bleeding: none  Assessment: s/p Procedure(s): HYSTERECTOMY VAGINAL (N/A) CYSTOSCOPY (N/A): stable  Plan: NPO  Good UOP Encourage IS SCDs for DVT prophylaxis Check CBC   LOS: 1 day    Olivya Sobol Y 11/29/2016, 6:20 PM

## 2016-11-29 NOTE — Op Note (Signed)
Preop Diagnosis: Recurrent Dysplasia  Postop Diagnosis: Recurrent Dysplasia  Procedure: HYSTERECTOMY VAGINAL CYSTOSCOPY   Anesthesia: General   Anesthesiologist: Cristela BlueJackson, Kyle, MD   Attending: Osborn Cohooberts, Magdelene Ruark, MD   Assistant: E.Lowell GuitarPowell, PA-C  Findings: Uterus and cervix wnl (ovaries not visualized and tubes previously removed)  Pathology: Uterus and cervix  Fluids: 2000 cc  UOP: 130 cc  EBL: 50 cc  Complications: None  Procedure: The patient was taken to the operating room after the risks, benefits and alternatives were discussed with the patient. The patient verbalized understanding and consent signed and witnessed. The patient was placed under general anesthesia per the anesthesiologist and a timeout was performed per protocol. The patient was prepped and draped in the normal sterile fashion in the dorsal lithotomy position.  A weighted speculum was placed in the patient's vagina and the anterior lip of the cervix was grasped with a single-tooth tenaculum and Dever retractors were placed for vaginal wall retraction. The cervix was circumscribed with the bovie after injecting the cervix with pitressin at a concentration of 20 units of Pitressin in 50 cc of normal saline.  Once the cervix was circumscribed the anterior cul-de-sac was entered without difficulty although some adhesions of the bladder were noted.  The posterior cul-de-sac was entered without difficulty as well.  Curved Heaney clamps were used to clamp the uterosacral and cardinal ligaments and the tissue was then cut and suture ligated using 0 Vicryl. This was done bilaterally and sequentially. The remaining parametrial tissue was clamped, cut and suture ligated using 0 Vicryl in a sequential and bilateral fashion as well.  The uterine fundus was exteriorized and the remaining pedicles were bilaterally clamped, cut and suture ligated with 0 vicryl.  A McCall culdoplasty stitch was placed.  The uterus and cervix were  handed off to be sent to pathology.  The angles of the cuff were sutured using 0 vicryl.  The cuff was then repaired to the midline with figure-of-eight and interrupted stitches of 0 Vicryl.  The McCall stitch was tied and the cuff was noted to be hemostatic.  The patient tolerated the procedure well and was returned to the recovery room in good condition.

## 2016-11-30 ENCOUNTER — Encounter (HOSPITAL_COMMUNITY): Payer: Self-pay | Admitting: Obstetrics and Gynecology

## 2016-11-30 DIAGNOSIS — N870 Mild cervical dysplasia: Secondary | ICD-10-CM | POA: Diagnosis not present

## 2016-11-30 LAB — CBC
HEMATOCRIT: 37 % (ref 36.0–46.0)
Hemoglobin: 11.8 g/dL — ABNORMAL LOW (ref 12.0–15.0)
MCH: 27.3 pg (ref 26.0–34.0)
MCHC: 31.9 g/dL (ref 30.0–36.0)
MCV: 85.6 fL (ref 78.0–100.0)
PLATELETS: 234 10*3/uL (ref 150–400)
RBC: 4.32 MIL/uL (ref 3.87–5.11)
RDW: 15 % (ref 11.5–15.5)
WBC: 11.8 10*3/uL — AB (ref 4.0–10.5)

## 2016-11-30 MED ORDER — OXYCODONE-ACETAMINOPHEN 5-325 MG PO TABS: 1.0000 | ORAL_TABLET | ORAL | 0 refills | Status: AC | PRN

## 2016-11-30 MED ORDER — IBUPROFEN 600 MG PO TABS: ORAL_TABLET | ORAL | 0 refills | Status: AC

## 2016-11-30 NOTE — Discharge Summary (Signed)
Physician Discharge Summary  Patient ID: Ruth Hale MRN: [id number] DOB/AGE: [birth year] 51 y.o.  Admit date: 11/29/2016 Discharge date: 11/30/2016   Discharge Diagnoses: Persistent Cervical Dysplasia Active Problems:   Cervical dysplasia   Operation: Total Vaginal Hysterectomy   Discharged Condition: Good  Hospital Course: On the date of admission, the patient underwent the aforementioned procedure and tolerated it well. Post operative course was unremarkable  with the patient resuming bowel and bladder function by post operative day #1 and was therefore deemed ready for discharge home.   Discharge hemoglobin/hematocrit  was 11.8/37 respectively.  Disposition: 01-Home or Self Care  Discharge Medications:  Allergies as of 11/30/2016      Reactions   Food    Ginger-rash/itching Cinnamon-rash/itching Nutmeg-rash/itching      Medication List    STOP taking these medications   diclofenac 75 MG EC tablet Commonly known as:  VOLTAREN     TAKE these medications   amLODipine 10 MG tablet Commonly known as:  NORVASC Take 10 mg by mouth daily.   [employer] EYE ITCH RELIEF 0.025 % ophthalmic solution Generic drug:  ketotifen 1 DROP INTO AFFECTED EYE TWICE A DAY AS NEEDED ITCHY/ALLERGY EYES.   ibuprofen 600 MG tablet Commonly known as:  ADVIL,MOTRIN 1 po  pc every 6 hours for 5 days then prn-pain   levocetirizine 5 MG tablet Commonly known as:  XYZAL Take 5 mg by mouth at bedtime.   montelukast 10 MG tablet Commonly known as:  SINGULAIR Take 10 mg by mouth at bedtime.   multivitamin tablet Take 1 tablet by mouth daily.   oxyCODONE-acetaminophen 5-325 MG tablet Commonly known as:  PERCOCET/ROXICET Take 1-2 tablets by mouth every 4 (four) hours as needed for severe pain (moderate to severe pain (when tolerating fluids)).   PRESCRIPTION MEDICATION 1 Syringe by Other route 2 (two) times a week. bi-weekly allergy injections   VENTOLIN HFA 108 (90 Base) MCG/ACT  inhaler Generic drug:  albuterol Inhale 2 puffs into the lungs every 4 (four) hours as needed. For wheezing/shortness of breath   Vitamin D3 5000 units Tabs Take 5,000 Units by mouth daily.        Follow-up: Dr. Woodroe ModeAngela Y. Su Hale on January 06, 2017 at  11:15 a.m.  SignedHenreitta Leber: Ruth Watling, PA-C 11/30/2016, 8:22 AM

## 2016-11-30 NOTE — Progress Notes (Signed)
Ruth Hale is a51 y.o.  [id number]  Post Op Date # 1:  TVH  Subjective: Patient is Doing well postoperatively. Patient has Pain is controlled with current analgesics. Medications being used: prescription NSAID's including Ibuprofen 600 mg and narcotic analgesics including Percocet.. Ambulating in the halls without dizziness, tolerating liquids and crackers without nausea, has passed flatus but hasn't voided since Foley was removed approximately 40 minutes ago.   Objective: Vital signs in last 24 hours: Temp:  [97.6 F (36.4 C)-99.4 F (37.4 C)] 97.6 F (36.4 C) (06/12 0329) Pulse Rate:  [29-106] 65 (06/12 0329) Resp:  [15-21] 18 (06/12 0329) BP: (120-150)/(62-77) 120/63 (06/12 0329) SpO2:  [94 %-100 %] 100 % (06/12 0329) Weight:  [279 lb (126.6 kg)] 279 lb (126.6 kg) (06/11 1243)  Intake/Output from previous day: 06/11 0701 - 06/12 0700 In: 5747.8 [P.O.:1910; I.V.:3837.8] Out: 5355 [Urine:5305] Intake/Output this shift: No intake/output data recorded.  Recent Labs Lab 11/29/16 0711 11/29/16 1808 11/30/16 0522  WBC 5.3 7.7 11.8*  HGB 13.8 12.9 11.8*  HCT 43.6 40.8 37.0  PLT 271 242 234     Recent Labs Lab 11/23/16 0955  NA 138  K 3.6  CL 105  CO2 25  BUN 13  CREATININE 1.08*  CALCIUM 9.1  GLUCOSE 100*    EXAM: General: alert, cooperative and no distress Resp: clear to auscultation bilaterally Cardio: regular rate and rhythm, S1, S2 normal, no murmur, click, rub or gallop GI: Bowel sounds present and soft. Extremities: Homans sign is negative, no sign of DVT and no calf tenderness. Vaginal Bleeding: none   Assessment: s/p Procedure(s): HYSTERECTOMY VAGINAL CYSTOSCOPY: stable and progressing well  Plan: Advance diet Routine care with possible discharge home later today.  LOS: 1 day    POWELL,ELMIRA, PA-C 11/30/2016 7:37 AM  Doing well.  Pt ready to go home.  Tolerating regular diet, +flatus and +BM.  Voiding without difficulty and ambulating  well.  D/c instructions reviewed.

## 2016-12-02 NOTE — Anesthesia Postprocedure Evaluation (Signed)
Anesthesia Post Note  Patient: Ruth Hale  Procedure(s) Performed: Procedure(s) (LRB): HYSTERECTOMY VAGINAL (N/A) CYSTOSCOPY (N/A)     Patient location during evaluation: PACU Anesthesia Type: General Level of consciousness: awake and alert Pain management: pain level controlled Vital Signs Assessment: post-procedure vital signs reviewed and stable Respiratory status: spontaneous breathing, nonlabored ventilation, respiratory function stable and patient connected to nasal cannula oxygen Cardiovascular status: blood pressure returned to baseline and stable Postop Assessment: no signs of nausea or vomiting Anesthetic complications: no    Last Vitals:  Vitals:   11/30/16 0800 11/30/16 1200  BP: 114/60 (!) 143/65  Pulse: 74 (!) 58  Resp: 18 18  Temp: 36.8 C 36.7 C    Last Pain:  Vitals:   11/30/16 1358  TempSrc:   PainSc: 0-No pain                 Thimothy Barretta EDWARD

## 2017-04-28 DIAGNOSIS — M25561 Pain in right knee: Secondary | ICD-10-CM | POA: Diagnosis not present

## 2017-04-29 DIAGNOSIS — M25561 Pain in right knee: Secondary | ICD-10-CM | POA: Diagnosis not present

## 2017-05-04 DIAGNOSIS — I10 Essential (primary) hypertension: Secondary | ICD-10-CM | POA: Diagnosis not present

## 2017-05-04 DIAGNOSIS — S83104A Unspecified dislocation of right knee, initial encounter: Secondary | ICD-10-CM | POA: Diagnosis not present

## 2017-05-04 DIAGNOSIS — F411 Generalized anxiety disorder: Secondary | ICD-10-CM | POA: Diagnosis not present

## 2017-05-04 DIAGNOSIS — D863 Sarcoidosis of skin: Secondary | ICD-10-CM | POA: Diagnosis not present

## 2017-05-06 DIAGNOSIS — M25561 Pain in right knee: Secondary | ICD-10-CM | POA: Diagnosis not present

## 2017-05-13 DIAGNOSIS — L03311 Cellulitis of abdominal wall: Secondary | ICD-10-CM | POA: Diagnosis not present

## 2017-05-13 DIAGNOSIS — L02211 Cutaneous abscess of abdominal wall: Secondary | ICD-10-CM | POA: Diagnosis not present

## 2017-05-14 DIAGNOSIS — M25561 Pain in right knee: Secondary | ICD-10-CM | POA: Diagnosis not present

## 2017-05-20 DIAGNOSIS — M25561 Pain in right knee: Secondary | ICD-10-CM | POA: Diagnosis not present

## 2017-05-22 DIAGNOSIS — L98499 Non-pressure chronic ulcer of skin of other sites with unspecified severity: Secondary | ICD-10-CM | POA: Diagnosis not present

## 2017-06-03 DIAGNOSIS — M25561 Pain in right knee: Secondary | ICD-10-CM | POA: Diagnosis not present

## 2017-06-28 DIAGNOSIS — Z1231 Encounter for screening mammogram for malignant neoplasm of breast: Secondary | ICD-10-CM | POA: Diagnosis not present

## 2017-06-28 DIAGNOSIS — Z1272 Encounter for screening for malignant neoplasm of vagina: Secondary | ICD-10-CM | POA: Diagnosis not present

## 2017-06-28 DIAGNOSIS — Z6841 Body Mass Index (BMI) 40.0 and over, adult: Secondary | ICD-10-CM | POA: Diagnosis not present

## 2017-06-28 DIAGNOSIS — Z139 Encounter for screening, unspecified: Secondary | ICD-10-CM | POA: Diagnosis not present

## 2017-06-28 DIAGNOSIS — Z1211 Encounter for screening for malignant neoplasm of colon: Secondary | ICD-10-CM | POA: Diagnosis not present

## 2017-06-28 DIAGNOSIS — Z01411 Encounter for gynecological examination (general) (routine) with abnormal findings: Secondary | ICD-10-CM | POA: Diagnosis not present

## 2017-07-07 DIAGNOSIS — Z1211 Encounter for screening for malignant neoplasm of colon: Secondary | ICD-10-CM | POA: Diagnosis not present

## 2017-07-15 DIAGNOSIS — M25561 Pain in right knee: Secondary | ICD-10-CM | POA: Diagnosis not present

## 2017-07-18 IMAGING — DX DG ABD PORTABLE 1V
2 series · 2 of 2 positions shown · non-contrast
Comparison: None.

CLINICAL DATA: Incorrect instrument count.

EXAM:
PORTABLE ABDOMEN - 1 VIEW

[abdomen kub (1 of 2)]
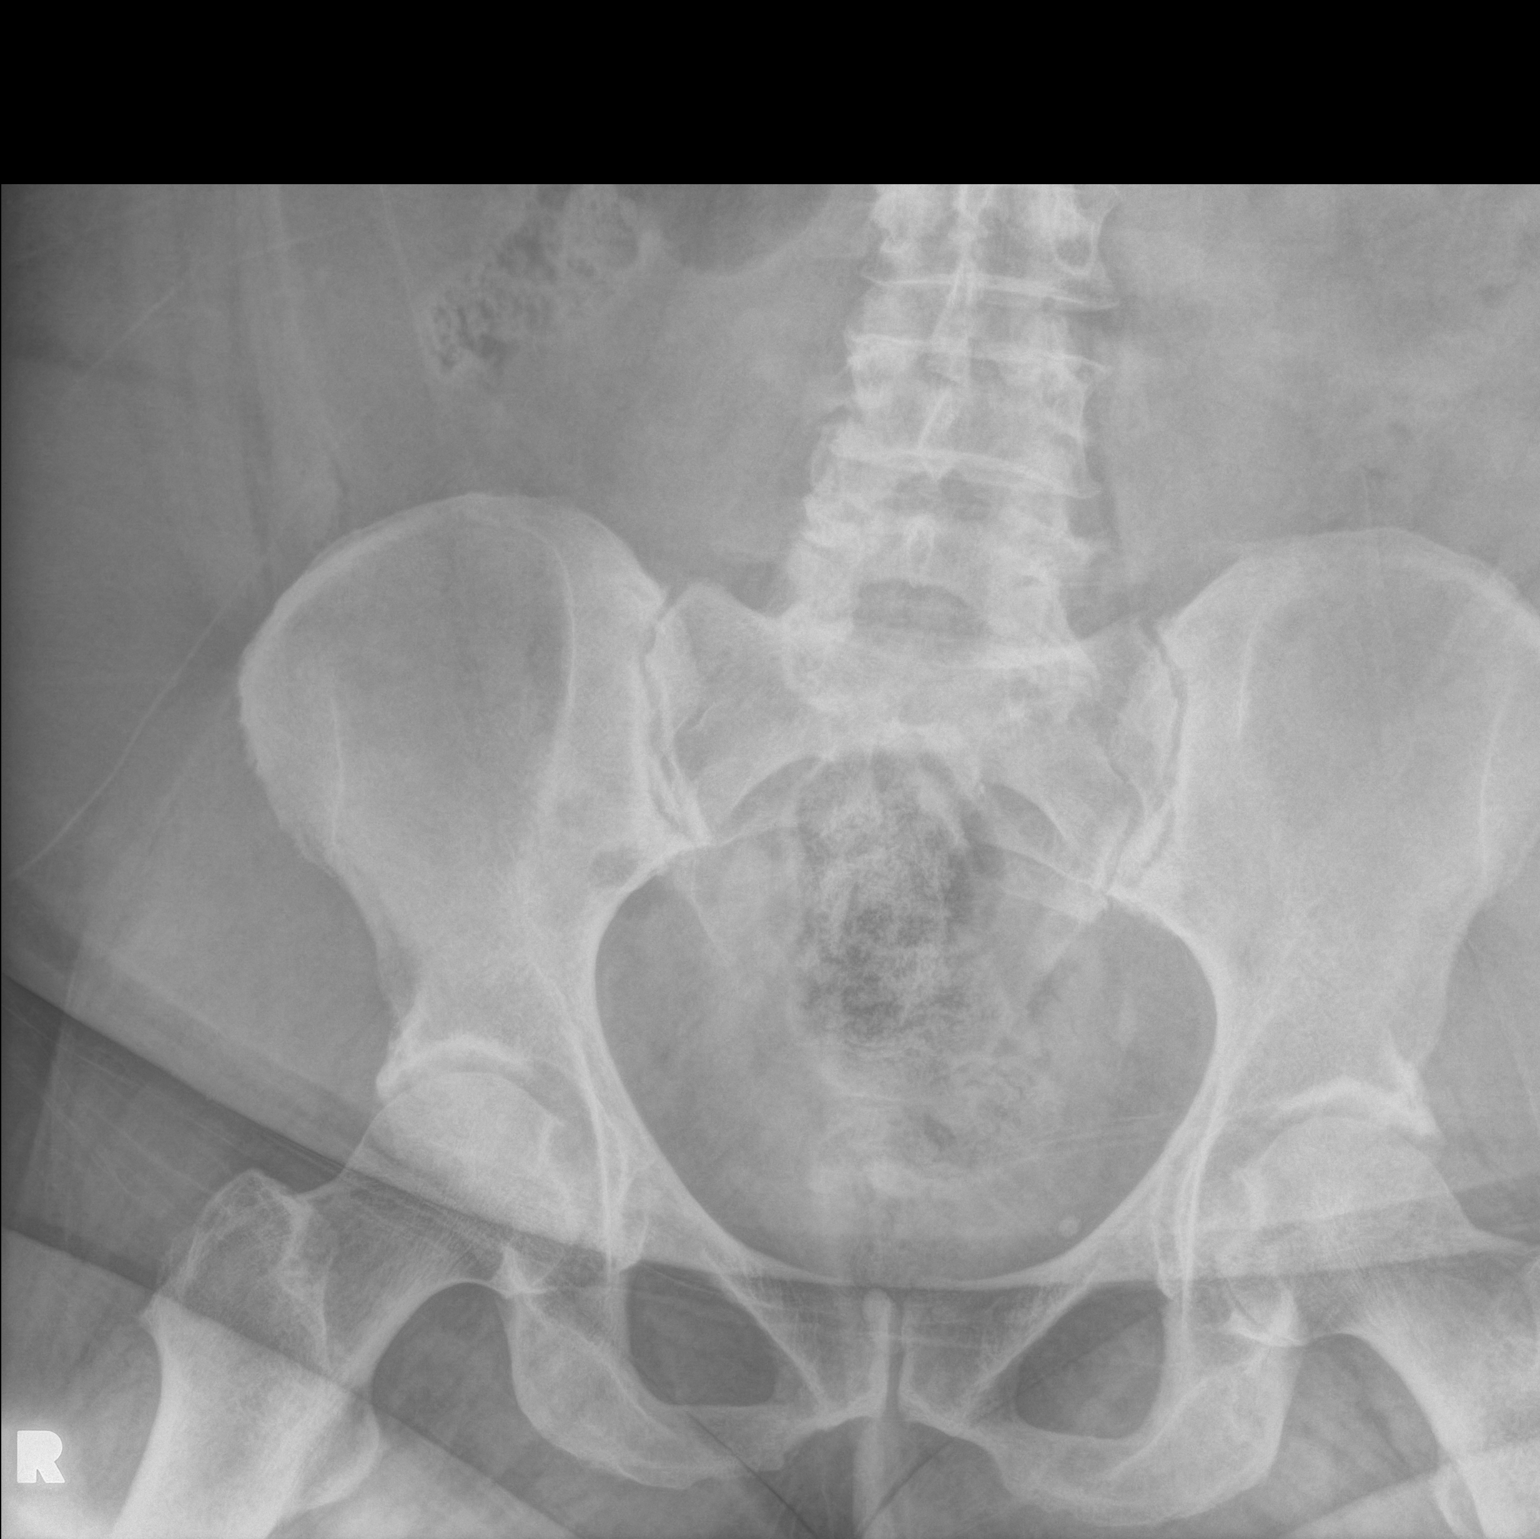

[abdomen kub (2 of 2)]
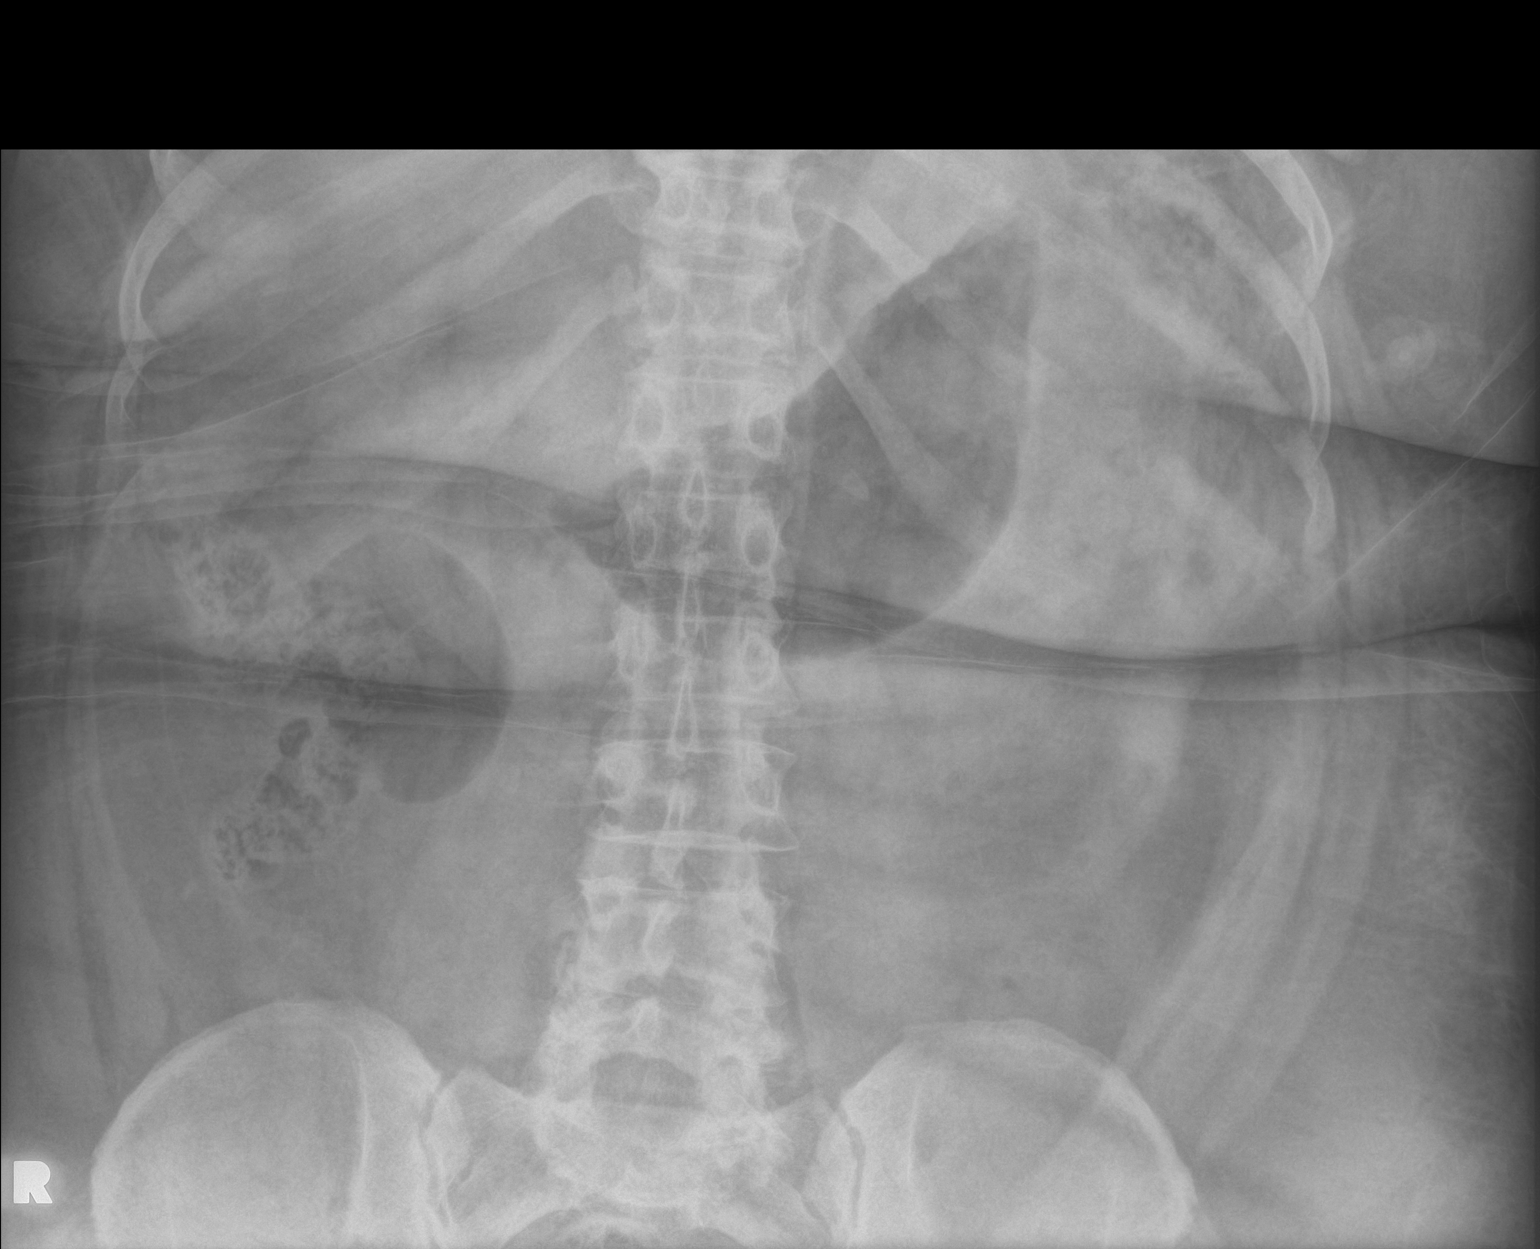

[2 of 2 positions shown; findings below may reference images not displayed]

FINDINGS: The bowel gas pattern is normal. No radio-opaque calculi or other
significant radiographic abnormality are seen. No radiopaque foreign
bodies identified to suggest retained instrument.
IMPRESSION: No surgical instruments or suture needles identified.

## 2017-07-19 DIAGNOSIS — M25561 Pain in right knee: Secondary | ICD-10-CM | POA: Diagnosis not present

## 2017-07-25 DIAGNOSIS — M25561 Pain in right knee: Secondary | ICD-10-CM | POA: Diagnosis not present

## 2017-08-01 DIAGNOSIS — M25561 Pain in right knee: Secondary | ICD-10-CM | POA: Diagnosis not present

## 2017-08-09 DIAGNOSIS — M25561 Pain in right knee: Secondary | ICD-10-CM | POA: Diagnosis not present

## 2017-08-12 DIAGNOSIS — M25561 Pain in right knee: Secondary | ICD-10-CM | POA: Diagnosis not present

## 2017-08-19 DIAGNOSIS — Z1211 Encounter for screening for malignant neoplasm of colon: Secondary | ICD-10-CM | POA: Diagnosis not present

## 2017-08-19 DIAGNOSIS — D120 Benign neoplasm of cecum: Secondary | ICD-10-CM | POA: Diagnosis not present

## 2017-08-19 DIAGNOSIS — D127 Benign neoplasm of rectosigmoid junction: Secondary | ICD-10-CM | POA: Diagnosis not present

## 2017-08-19 DIAGNOSIS — K621 Rectal polyp: Secondary | ICD-10-CM | POA: Diagnosis not present

## 2017-08-19 DIAGNOSIS — K635 Polyp of colon: Secondary | ICD-10-CM | POA: Diagnosis not present

## 2017-08-19 DIAGNOSIS — D128 Benign neoplasm of rectum: Secondary | ICD-10-CM | POA: Diagnosis not present

## 2017-09-02 DIAGNOSIS — M25561 Pain in right knee: Secondary | ICD-10-CM | POA: Diagnosis not present

## 2017-10-31 DIAGNOSIS — I10 Essential (primary) hypertension: Secondary | ICD-10-CM | POA: Diagnosis not present

## 2017-10-31 DIAGNOSIS — M85872 Other specified disorders of bone density and structure, left ankle and foot: Secondary | ICD-10-CM | POA: Diagnosis not present

## 2017-10-31 DIAGNOSIS — F411 Generalized anxiety disorder: Secondary | ICD-10-CM | POA: Diagnosis not present

## 2017-10-31 DIAGNOSIS — J309 Allergic rhinitis, unspecified: Secondary | ICD-10-CM | POA: Diagnosis not present

## 2017-11-09 ENCOUNTER — Other Ambulatory Visit: Payer: Self-pay | Admitting: Family Medicine

## 2017-11-09 DIAGNOSIS — M85872 Other specified disorders of bone density and structure, left ankle and foot: Secondary | ICD-10-CM

## 2017-12-15 ENCOUNTER — Ambulatory Visit
Admission: RE | Admit: 2017-12-15 | Discharge: 2017-12-15 | Disposition: A | Discharge status: Home / Self Care | Payer: Commercial Managed Care - HMO | Source: Ambulatory Visit | Attending: Family Medicine | Admitting: Family Medicine

## 2017-12-15 DIAGNOSIS — Z780 Asymptomatic menopausal state: Secondary | ICD-10-CM | POA: Diagnosis not present

## 2017-12-15 DIAGNOSIS — M85851 Other specified disorders of bone density and structure, right thigh: Secondary | ICD-10-CM | POA: Diagnosis not present

## 2017-12-15 DIAGNOSIS — M85872 Other specified disorders of bone density and structure, left ankle and foot: Secondary | ICD-10-CM

## 2022-01-01 DIAGNOSIS — I10 Essential (primary) hypertension: Secondary | ICD-10-CM | POA: Diagnosis not present

## 2022-01-01 DIAGNOSIS — K219 Gastro-esophageal reflux disease without esophagitis: Secondary | ICD-10-CM | POA: Diagnosis not present

## 2022-01-01 DIAGNOSIS — R109 Unspecified abdominal pain: Secondary | ICD-10-CM | POA: Diagnosis not present

## 2022-01-15 DIAGNOSIS — I10 Essential (primary) hypertension: Secondary | ICD-10-CM | POA: Diagnosis not present

## 2022-01-15 DIAGNOSIS — Z23 Encounter for immunization: Secondary | ICD-10-CM | POA: Diagnosis not present

## 2022-01-15 DIAGNOSIS — F411 Generalized anxiety disorder: Secondary | ICD-10-CM | POA: Diagnosis not present

## 2022-02-02 DIAGNOSIS — R110 Nausea: Secondary | ICD-10-CM | POA: Diagnosis not present

## 2022-02-02 DIAGNOSIS — R1013 Epigastric pain: Secondary | ICD-10-CM | POA: Diagnosis not present

## 2022-02-02 DIAGNOSIS — R195 Other fecal abnormalities: Secondary | ICD-10-CM | POA: Diagnosis not present

## 2022-02-02 DIAGNOSIS — K219 Gastro-esophageal reflux disease without esophagitis: Secondary | ICD-10-CM | POA: Diagnosis not present

## 2022-02-04 DIAGNOSIS — R195 Other fecal abnormalities: Secondary | ICD-10-CM | POA: Diagnosis not present

## 2022-02-25 DIAGNOSIS — R197 Diarrhea, unspecified: Secondary | ICD-10-CM | POA: Diagnosis not present

## 2022-02-25 DIAGNOSIS — R1013 Epigastric pain: Secondary | ICD-10-CM | POA: Diagnosis not present

## 2022-02-25 DIAGNOSIS — K2930 Chronic superficial gastritis without bleeding: Secondary | ICD-10-CM | POA: Diagnosis not present

## 2022-02-25 DIAGNOSIS — K2970 Gastritis, unspecified, without bleeding: Secondary | ICD-10-CM | POA: Diagnosis not present

## 2022-03-02 DIAGNOSIS — F411 Generalized anxiety disorder: Secondary | ICD-10-CM | POA: Diagnosis not present

## 2022-03-02 DIAGNOSIS — K219 Gastro-esophageal reflux disease without esophagitis: Secondary | ICD-10-CM | POA: Diagnosis not present

## 2022-06-01 DIAGNOSIS — J209 Acute bronchitis, unspecified: Secondary | ICD-10-CM | POA: Diagnosis not present

## 2022-06-09 ENCOUNTER — Encounter (HOSPITAL_BASED_OUTPATIENT_CLINIC_OR_DEPARTMENT_OTHER): Payer: Self-pay

## 2022-06-09 ENCOUNTER — Emergency Department (HOSPITAL_BASED_OUTPATIENT_CLINIC_OR_DEPARTMENT_OTHER)
Admission: EM | Admit: 2022-06-09 | Discharge: 2022-06-10 | Disposition: A | Discharge status: Home / Self Care | Payer: BC Managed Care – PPO | Attending: Emergency Medicine | Admitting: Emergency Medicine

## 2022-06-09 ENCOUNTER — Other Ambulatory Visit: Payer: Self-pay

## 2022-06-09 ENCOUNTER — Emergency Department (HOSPITAL_BASED_OUTPATIENT_CLINIC_OR_DEPARTMENT_OTHER): Payer: BC Managed Care – PPO | Admitting: Radiology

## 2022-06-09 DIAGNOSIS — J069 Acute upper respiratory infection, unspecified: Secondary | ICD-10-CM | POA: Diagnosis not present

## 2022-06-09 DIAGNOSIS — R059 Cough, unspecified: Secondary | ICD-10-CM | POA: Insufficient documentation

## 2022-06-09 DIAGNOSIS — R0602 Shortness of breath: Secondary | ICD-10-CM | POA: Diagnosis not present

## 2022-06-09 DIAGNOSIS — Z1152 Encounter for screening for COVID-19: Secondary | ICD-10-CM | POA: Diagnosis not present

## 2022-06-09 LAB — RESP PANEL BY RT-PCR (RSV, FLU A&B, COVID)  RVPGX2
Influenza A by PCR: NEGATIVE
Influenza B by PCR: NEGATIVE
Resp Syncytial Virus by PCR: NEGATIVE
SARS Coronavirus 2 by RT PCR: NEGATIVE

## 2022-06-09 NOTE — ED Triage Notes (Signed)
Patient here POV from Home.  Endorses Cough that began 3 Weeks ago. Worsened since it began.   Given Azithromycin and Tessalon approximately 1 Week ago but states symptoms have not improved.   NAD Noted during Triage. A&Ox4. GCS 15. Ambulatory.

## 2022-06-09 NOTE — ED Notes (Signed)
Late entry -- Entered room to find pt sitting up to chair.  Pt able to xfer independently to stretcher for vital sign re-check.  Reports persistent mostly non-prod cough with associated wheezing, HA, sob and intermittent epigastric and mid-sternal cp triggered by cough.  RR even and unlabored on RA with symmetrical rise and fall of chest; 100% O2 sats on RA; no coughing noted while this nurse at bedside.  Pt oriented to room - awaits ED provider.  Will monitor for acute changes and maintain plan of care.

## 2022-06-10 MED ORDER — PREDNISONE 10 MG PO TABS: 20.0000 mg | ORAL_TABLET | Freq: Two times a day (BID) | ORAL | 0 refills | Status: AC

## 2022-06-10 MED ORDER — DOXYCYCLINE HYCLATE 100 MG PO CAPS: 100.0000 mg | ORAL_CAPSULE | Freq: Two times a day (BID) | ORAL | 0 refills | Status: AC

## 2022-06-10 NOTE — Discharge Instructions (Addendum)
Begin taking doxycycline and prednisone as prescribed.  Continue over-the-counter medications as needed for relief of symptoms.  Follow-up with primary doctor if not improving in the next week.

## 2022-06-10 NOTE — ED Provider Notes (Signed)
[facility] Provider Note   CSN: [id number] Arrival date & time: 06/09/22  1802     History  Chief Complaint  Patient presents with   Cough    Ruth Hale is a 56 y.o. female.  Patient is a 56 year old female with past medical history of sarcoidosis, GERD, anemia.  Patient presenting today for evaluation of cough.  She describes a 3-week history of nonproductive cough that has been unrelieved with Zithromax prescribed by her primary doctor.  She denies any fevers or chills.  She denies any chest pain or difficulty breathing.  The history is provided by the patient.       Home Medications Prior to Admission medications   Medication Sig Start Date End Date Taking? Authorizing Provider  albuterol (VENTOLIN HFA) 108 (90 Base) MCG/ACT inhaler Inhale 2 puffs into the lungs every 4 (four) hours as needed. For wheezing/shortness of breath    [provider]  amLODipine (NORVASC) 10 MG tablet Take 10 mg by mouth daily.    [provider]  Cholecalciferol (VITAMIN D3) 5000 units TABS Take 5,000 Units by mouth daily.    [provider]  [employer] EYE ITCH RELIEF 0.025 % ophthalmic solution 1 DROP INTO AFFECTED EYE TWICE A DAY AS NEEDED ITCHY/ALLERGY EYES. 07/23/16   [provider]  ibuprofen (ADVIL,MOTRIN) 600 MG tablet 1 po  pc every 6 hours for 5 days then prn-pain 11/30/16   Earnstine Regal, PA-C  levocetirizine (XYZAL) 5 MG tablet Take 5 mg by mouth at bedtime.  09/26/16   [provider]  montelukast (SINGULAIR) 10 MG tablet Take 10 mg by mouth at bedtime.  09/19/16   [provider]  Multiple Vitamin (MULTIVITAMIN) tablet Take 1 tablet by mouth daily.    [provider]  oxyCODONE-acetaminophen (PERCOCET/ROXICET) 5-325 MG tablet Take 1-2 tablets by mouth every 4 (four) hours as needed for severe pain (moderate to severe pain (when tolerating fluids)). 11/30/16   Earnstine Regal, PA-C  PRESCRIPTION  MEDICATION 1 Syringe by Other route 2 (two) times a week. bi-weekly allergy injections    [provider]      Allergies    Food    Review of Systems   Review of Systems  All other systems reviewed and are negative.   Physical Exam Updated Vital Signs BP (!) 159/83 (BP Location: Right Arm)   Pulse 72   Temp 98.1 F (36.7 C) (Oral)   Resp 20   Ht 5\' 7"  (1.702 m)   Wt 126.6 kg   LMP 01/05/2011   SpO2 100%   BMI 43.71 kg/m  Physical Exam Vitals and nursing note reviewed.  Constitutional:      General: She is not in acute distress.    Appearance: She is well-developed. She is not diaphoretic.  HENT:     Head: Normocephalic and atraumatic.  Cardiovascular:     Rate and Rhythm: Normal rate and regular rhythm.     Heart sounds: No murmur heard.    No friction rub. No gallop.  Pulmonary:     Effort: Pulmonary effort is normal. No respiratory distress.     Breath sounds: Normal breath sounds. No wheezing.  Abdominal:     General: Bowel sounds are normal. There is no distension.     Palpations: Abdomen is soft.     Tenderness: There is no abdominal tenderness.  Musculoskeletal:        General: Normal range of motion.     Cervical back: Normal  range of motion and neck supple.  Skin:    General: Skin is warm and dry.  Neurological:     General: No focal deficit present.     Mental Status: She is alert and oriented to person, place, and time.     ED Results / Procedures / Treatments   Labs (all labs ordered are listed, but only abnormal results are displayed) Labs Reviewed  RESP PANEL BY RT-PCR (RSV, FLU A&B, COVID)  [id number]    EKG None  Radiology DG Chest 2 View  Result Date: 06/09/2022 CLINICAL DATA:  Shortness of breath EXAM: CHEST - 2 VIEW COMPARISON:  None Available. FINDINGS: Mild bronchitic changes. No consolidation or effusion. Borderline cardiomegaly. No pneumothorax IMPRESSION: Mild bronchitic changes. Borderline cardiomegaly. Electronically  Signed   By: Jasmine Pang M.D.   On: 06/09/2022 19:16    Procedures Procedures    Medications Ordered in ED Medications - No data to display  ED Course/ Medical Decision Making/ A&P  Patient presenting with cough for the past 3 weeks.  She arrives here with stable vital signs and unremarkable physical examination.  Chest x-ray shows mild bronchitic changes and COVID/flu swabs are negative.  Due to the persistent nature of the cough, I will prescribe doxycycline and prednisone and see if this helps.  Patient to continue over-the-counter medications and return as needed.  Final Clinical Impression(s) / ED Diagnoses Final diagnoses:  None    Rx / DC Orders ED Discharge Orders     None         Geoffery Lyons, MD 06/10/22 [phone]

## 2022-06-10 NOTE — ED Notes (Signed)
Pt agreeable with d/c plan as discussed by provider- this nurse has verbally reinforced d/c instructions and provided pt with written copy.  Pt acknowledges verbal understanding and denies any addl questions, concerns, needs- pt ambulatory independently at d/c with steady gait - no acute changes/distress noted.
